# Patient Record
Sex: Male | Born: 1985 | Marital: Single | Smoking: Current every day smoker
Health system: Southern US, Community
[De-identification: ages and names within clinical notes are randomized; demographics above are authoritative.]

## PROBLEM LIST (undated history)

## (undated) DIAGNOSIS — S060X9A Concussion with loss of consciousness of unspecified duration, initial encounter: Secondary | ICD-10-CM

## (undated) DIAGNOSIS — F419 Anxiety disorder, unspecified: Secondary | ICD-10-CM

## (undated) DIAGNOSIS — F32A Depression, unspecified: Secondary | ICD-10-CM

## (undated) DIAGNOSIS — S060XAA Concussion with loss of consciousness status unknown, initial encounter: Secondary | ICD-10-CM

## (undated) HISTORY — DX: Concussion with loss of consciousness status unknown, initial encounter: S06.0XAA

## (undated) HISTORY — DX: Concussion with loss of consciousness of unspecified duration, initial encounter: S06.0X9A

---

## 2016-12-31 ENCOUNTER — Ambulatory Visit (INDEPENDENT_AMBULATORY_CARE_PROVIDER_SITE_OTHER): Payer: Managed Care, Other (non HMO) | Admitting: Family Medicine

## 2016-12-31 ENCOUNTER — Encounter: Payer: Self-pay | Admitting: Family Medicine

## 2016-12-31 VITALS — BP 160/100 | HR 96 | Temp 98.2°F | Ht 65.0 in | Wt 202.4 lb

## 2016-12-31 DIAGNOSIS — F32A Depression, unspecified: Secondary | ICD-10-CM

## 2016-12-31 DIAGNOSIS — R030 Elevated blood-pressure reading, without diagnosis of hypertension: Secondary | ICD-10-CM | POA: Diagnosis not present

## 2016-12-31 DIAGNOSIS — F419 Anxiety disorder, unspecified: Secondary | ICD-10-CM | POA: Diagnosis not present

## 2016-12-31 DIAGNOSIS — F329 Major depressive disorder, single episode, unspecified: Secondary | ICD-10-CM | POA: Diagnosis not present

## 2016-12-31 DIAGNOSIS — F17210 Nicotine dependence, cigarettes, uncomplicated: Secondary | ICD-10-CM | POA: Insufficient documentation

## 2016-12-31 DIAGNOSIS — Z720 Tobacco use: Secondary | ICD-10-CM | POA: Diagnosis not present

## 2016-12-31 DIAGNOSIS — Z8679 Personal history of other diseases of the circulatory system: Secondary | ICD-10-CM | POA: Insufficient documentation

## 2016-12-31 MED ORDER — ESCITALOPRAM OXALATE 10 MG PO TABS: 10.0000 mg | ORAL_TABLET | Freq: Every day | ORAL | 1 refills | Status: DC

## 2016-12-31 NOTE — Assessment & Plan Note (Signed)
Discuss smoking cessation. Patient is not a candidate for Chantix. I think Wellbutrin would possibly be a little stimulating given his anxiety. We'll see how he does with Lexapro for his depression and anxiety and see if that will make it easier for him to use nicotine supplementation.

## 2016-12-31 NOTE — Assessment & Plan Note (Signed)
Elevated blood pressure though no prior history of hypertension. 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 Discussed the option of starting on medication today though we decided to hold off on starting medication today and decided on having him check his blood pressure on several occasions at home and let us know what it has been running. He has follow-up in one week for a nurse visit for blood pressure check. We will consider medication if still elevated at that time.

## 2016-12-31 NOTE — Assessment & Plan Note (Signed)
Patient with anxiety and depression. No prior medication for this. He does see a therapist. No SI or HI. Discussed starting on Lexapro. He'll continue to see his therapist. Given return precautions. Follow-up in 2 months.

## 2016-12-31 NOTE — Progress Notes (Signed)
Tommi Rumps, MD Phone: [phone]  Craig Shelton is a 31 y.o. male who presents today for new patient visit.  Depression/anxiety: Patient notes he has had issues with depression and anxiety for some time now. He is followed by a therapist and has seen her for the last 2-3 years. He notes he feels like this has gotten worse over the years. He's never been on medication. He has a hard time concentrating as well and wonders if he has ADD. No SI or HI.  Elevated blood pressure: No history of elevated blood pressure. He does drink 2 energy drinks per day. No chest pain or shortness of breath. 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  Tobacco abuse: He does smoke about a pack and half per day since he was [age] years old. He is interested in quitting though he has tried nicotine replacement previously. He has heard about Chantix though doesn't think he wants to do this and I think that is a good decision given his anxiety and depression.  Active Ambulatory Problems    Diagnosis Date Noted  . Anxiety and depression 12/31/2016  . Elevated blood pressure reading 12/31/2016  . Tobacco abuse 12/31/2016   Resolved Ambulatory Problems    Diagnosis Date Noted  . No Resolved Ambulatory Problems   No Additional Past Medical History    Family History  Problem Relation Age of Onset  . Colon cancer Mother   . Prostate cancer Maternal Grandfather   . Stomach cancer Paternal Grandmother   . Prostate cancer Paternal Grandfather     Social History   Social History  . Marital status: Single    Spouse name: N/A  . Number of children: N/A  . Years of education: N/A   Occupational History  . Not on file.   Social History Main Topics  . Smoking status: Current Every Day Smoker  . Smokeless tobacco: Never Used  . Alcohol use Yes  . Drug use: No  . Sexual activity: Not on file   Other Topics Concern  . Not on file    Social History Narrative  . No narrative on file    ROS  General:  Negative for nexplained weight loss, fever Skin: Negative for new or changing mole, sore that won't heal HEENT: Negative for trouble hearing, trouble seeing, ringing in ears, mouth sores, hoarseness, change in voice, dysphagia. CV:  Negative for chest pain, dyspnea, edema, palpitations Resp: Negative for cough, dyspnea, hemoptysis GI: Negative for nausea, vomiting, diarrhea, constipation, abdominal pain, melena, hematochezia. GU: Negative for dysuria, incontinence, urinary hesitance, hematuria, vaginal or penile discharge, polyuria, sexual difficulty, lumps in testicle or breasts MSK: Negative for muscle cramps or aches, joint pain or swelling Neuro: Negative for headaches, weakness, numbness, dizziness, passing out/fainting Psych: Positive for depression, anxiety, memory problems  Objective  Physical Exam Vitals:   12/31/16 1514 12/31/16 1541  BP: (!) 190/120 (!) 160/100  Pulse: 96   Temp: 98.2 F (36.8 C)   SpO2: 99%     BP Readings from Last 3 Encounters:  12/31/16 (!) 160/100   Wt Readings from Last 3 Encounters:  12/31/16 202 lb 6.4 oz (91.8 kg)    Physical Exam  Constitutional: No distress.  HENT:  Head: Normocephalic and atraumatic.  Mouth/Throat: Oropharynx is clear and moist. No oropharyngeal exudate.  Eyes: Pupils are equal, round, and reactive to light. Conjunctivae are normal.  Neck: Neck supple.  Cardiovascular: Normal rate, regular rhythm and normal heart sounds.   Pulmonary/Chest: Effort normal and breath sounds normal.  Abdominal: Soft. Bowel sounds are normal.  Musculoskeletal: He exhibits no edema.  Lymphadenopathy:    He has no cervical adenopathy.  Neurological: He is alert. Gait normal.  Skin: Skin is warm and dry. He is not diaphoretic.  Psychiatric:  Mood depressed, affect flat     Assessment/Plan:   Anxiety and depression Patient with anxiety and depression. No  prior medication for this. He does see a therapist. No SI or HI. Discussed starting on Lexapro. He'll continue to see his therapist. Given return precautions. Follow-up in 2 months.  Elevated blood pressure reading Elevated blood pressure though no prior history of hypertension. 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 Discussed the option of starting on medication today though we decided to hold off on starting medication today and decided on having him check his blood pressure on several occasions at home and let us know what it has been running. He has follow-up in one week for a nurse visit for blood pressure check. We will consider medication if still elevated at that time.  Tobacco abuse Discuss smoking cessation. Patient is not a candidate for Chantix. I think Wellbutrin would possibly be a little stimulating given his anxiety. We'll see how he does with Lexapro for his depression and anxiety and see if that will make it easier for him to use nicotine supplementation.   Orders Placed This Encounter  Procedures  . Comp Met (CMET)  . CBC  . TSH    Meds ordered this encounter  Medications  . escitalopram (LEXAPRO) 10 MG tablet    Sig: Take 1 tablet (10 mg total) by mouth daily.    Dispense:  90 tablet    Refill:  1     Tommi Rumps, MD [place]

## 2016-12-31 NOTE — Patient Instructions (Signed)
Nice to see you. We are going to start you on Lexapro for anxiety and depression. If you develop any side effects from this please let us know. Please do not discontinue this all at once. We will have you check your blood pressure a couple of times outside of the office and call us with the results. We'll have you come back in about a week for nurse check of your blood pressure. We will call with the lab results.

## 2017-01-01 LAB — COMPREHENSIVE METABOLIC PANEL
ALBUMIN: 4.7 g/dL (ref 3.5–5.2)
ALK PHOS: 57 U/L (ref 39–117)
ALT: 25 U/L (ref 0–53)
AST: 22 U/L (ref 0–37)
BILIRUBIN TOTAL: 0.6 mg/dL (ref 0.2–1.2)
BUN: 10 mg/dL (ref 6–23)
CO2: 30 mEq/L (ref 19–32)
CREATININE: 0.99 mg/dL (ref 0.40–1.50)
Calcium: 9.9 mg/dL (ref 8.4–10.5)
Chloride: 104 mEq/L (ref 96–112)
GFR: 94.22 mL/min (ref >60.00)
Glucose, Bld: 93 mg/dL (ref 70–99)
Potassium: 3.9 mEq/L (ref 3.5–5.1)
SODIUM: 140 meq/L (ref 135–145)
TOTAL PROTEIN: 7.3 g/dL (ref 6.0–8.3)

## 2017-01-01 LAB — CBC
HCT: 47.3 % (ref 39.0–52.0)
Hemoglobin: 16.2 g/dL (ref 13.0–17.0)
MCHC: 34.2 g/dL (ref 30.0–36.0)
MCV: 93.8 fl (ref 78.0–100.0)
Platelets: 222 10*3/uL (ref 150.0–400.0)
RBC: 5.04 Mil/uL (ref 4.22–5.81)
RDW: 13.4 % (ref 11.5–15.5)
WBC: 9.5 10*3/uL (ref 4.0–10.5)

## 2017-01-01 LAB — TSH: TSH: 1.47 u[IU]/mL (ref 0.35–4.50)

## 2017-01-02 ENCOUNTER — Telehealth: Payer: Self-pay | Admitting: Family Medicine

## 2017-01-02 NOTE — Telephone Encounter (Signed)
Advised patient of results. thanks

## 2017-01-02 NOTE — Telephone Encounter (Signed)
Pt called back returning your call. Thank you! °

## 2017-03-04 ENCOUNTER — Ambulatory Visit (INDEPENDENT_AMBULATORY_CARE_PROVIDER_SITE_OTHER): Payer: Managed Care, Other (non HMO) | Admitting: Family Medicine

## 2017-03-04 ENCOUNTER — Encounter: Payer: Self-pay | Admitting: Family Medicine

## 2017-03-04 VITALS — BP 138/90 | HR 80 | Temp 98.2°F | Wt 209.8 lb

## 2017-03-04 DIAGNOSIS — F419 Anxiety disorder, unspecified: Secondary | ICD-10-CM

## 2017-03-04 DIAGNOSIS — R030 Elevated blood-pressure reading, without diagnosis of hypertension: Secondary | ICD-10-CM

## 2017-03-04 DIAGNOSIS — R41840 Attention and concentration deficit: Secondary | ICD-10-CM

## 2017-03-04 DIAGNOSIS — F329 Major depressive disorder, single episode, unspecified: Secondary | ICD-10-CM

## 2017-03-04 MED ORDER — ESCITALOPRAM OXALATE 20 MG PO TABS: 20.0000 mg | ORAL_TABLET | Freq: Every day | ORAL | 2 refills | Status: DC

## 2017-03-04 NOTE — Progress Notes (Signed)
  Craig AlarEric Chilton Sallade, MD Phone: [phone]  Craig Shelton is a 31 y.o. male who presents today for follow-up.  Elevated blood pressure: He is not checking at home. No chest pain or shortness of breath. No history of medication.  Depression/anxiety: He notes no depression. Notes anxiety is quite a bit improved though still does have some symptoms. Notes he previously felt as though there was a dark cloud over him though that is not as bad. Has been seeing a therapist. Lexapro has been beneficial. No SI or HI. He does continue to have issues with focusing and feels that may be an issue as well.  ROS see history of present illness  Objective  Physical Exam Vitals:   03/04/17 1552 03/04/17 1613  BP: (!) 160/92 138/90  Pulse: 80   Temp: 98.2 F (36.8 C)   SpO2: 97%     BP Readings from Last 3 Encounters:  03/04/17 138/90  12/31/16 (!) 160/100   Wt Readings from Last 3 Encounters:  03/04/17 209 lb 12.8 oz (95.2 kg)  12/31/16 202 lb 6.4 oz (91.8 kg)    Physical Exam  Constitutional: No distress.  Cardiovascular: Normal rate, regular rhythm and normal heart sounds.   Pulmonary/Chest: Effort normal and breath sounds normal.  Neurological: He is alert. Gait normal.  Skin: He is not diaphoretic.  Psychiatric:  Affect flat     Assessment/Plan: Please see individual problem list.  Anxiety and depression Increase Lexapro. Refer to psychology for ADHD testing.  Elevated blood pressure reading Improved on recheck. Discussed option of starting medication versus monitoring at home. He will buy a blood pressure cuff and check at home for the next 1-2 weeks and call us with the results. If consistently higher than 140/90 at home will start on medication.   Orders Placed This Encounter  Procedures  . Ambulatory referral to Psychology    Referral Priority:   Routine    Referral Type:   Psychiatric    Referral Reason:   Specialty Services Required    Requested Specialty:    Psychology    Number of Visits Requested:   1    Meds ordered this encounter  Medications  . escitalopram (LEXAPRO) 20 MG tablet    Sig: Take 1 tablet (20 mg total) by mouth daily.    Dispense:  90 tablet    Refill:  2   Craig AlarEric Gaspar Fowle, MD [facility] [place]

## 2017-03-04 NOTE — Patient Instructions (Signed)
Nice to see you. We will increase your Lexapro to 20 mg daily. You may take two 10 mg tablets daily until you run out of your current supply and then start taking the 20 mg tablet. Please pick up a blood pressure cuff (brand omron) check your blood pressure daily. Please do this when you're at home and relaxed. Your goal is less than 140/90. Please contact us in 1-2 weeks with your readings.

## 2017-03-04 NOTE — Assessment & Plan Note (Addendum)
Increase Lexapro. Refer to psychology for ADHD testing.

## 2017-03-04 NOTE — Assessment & Plan Note (Signed)
Improved on recheck. Discussed option of starting medication versus monitoring at home. He will buy a blood pressure cuff and check at home for the next 1-2 weeks and call us with the results. If consistently higher than 140/90 at home will start on medication.

## 2017-03-05 NOTE — Progress Notes (Signed)
Patient notified

## 2017-06-05 ENCOUNTER — Ambulatory Visit: Payer: Managed Care, Other (non HMO) | Admitting: Family Medicine

## 2017-07-17 ENCOUNTER — Telehealth: Payer: Self-pay | Admitting: Family Medicine

## 2017-07-17 NOTE — Telephone Encounter (Signed)
PT dropped off paper work, he needs a letter for work about his anxiety meds. Papers are up front in color folder

## 2017-07-18 NOTE — Telephone Encounter (Signed)
Placed on your desk. 

## 2017-07-22 ENCOUNTER — Other Ambulatory Visit: Payer: Self-pay

## 2017-07-22 ENCOUNTER — Ambulatory Visit: Payer: Managed Care, Other (non HMO) | Admitting: Family Medicine

## 2017-07-22 ENCOUNTER — Encounter: Payer: Self-pay | Admitting: Family Medicine

## 2017-07-22 VITALS — BP 140/80 | HR 100 | Temp 98.2°F | Wt 211.0 lb

## 2017-07-22 DIAGNOSIS — F419 Anxiety disorder, unspecified: Secondary | ICD-10-CM | POA: Diagnosis not present

## 2017-07-22 DIAGNOSIS — G8929 Other chronic pain: Secondary | ICD-10-CM

## 2017-07-22 DIAGNOSIS — M5442 Lumbago with sciatica, left side: Secondary | ICD-10-CM | POA: Diagnosis not present

## 2017-07-22 DIAGNOSIS — R41840 Attention and concentration deficit: Secondary | ICD-10-CM | POA: Diagnosis not present

## 2017-07-22 DIAGNOSIS — F329 Major depressive disorder, single episode, unspecified: Secondary | ICD-10-CM

## 2017-07-22 DIAGNOSIS — M545 Low back pain: Secondary | ICD-10-CM

## 2017-07-22 NOTE — Assessment & Plan Note (Signed)
Improved.  Continue Lexapro.  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

## 2017-07-22 NOTE — Progress Notes (Signed)
  Craig AlarEric Aunika Kirsten, MD Phone: [phone]  Craig Shelton is a 32 y.o. male who presents today for follow-up.  Anxiety: Notes this is quite a bit better.  No depressive symptoms.  He has been on Lexapro with no side effects.  No SI.  He continues to intermittently see a therapist.  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  Attention deficit: He has had difficulty with concentrating and completing tasks for many years at work and at home.  He did not get the testing previously.  Chronic intermittent low back pain.  This is been going on for some time.  Typically related to what he is doing at work.  Occasionally does radiate down his left leg down the back and his leg will give out on him with this.  No numbness.  No saddle anesthesia.  No specific weakness.  No bowel or bladder incontinence.  He has not had any issues with it radiating down his leg in some time.  He does some stretches for it.  Does report a history of a concussion about 5 years ago when a tree branch hit him on the head while at work.  He has not had any lasting effects from this.  Social History   Tobacco Use  Smoking Status Current Every Day Smoker  Smokeless Tobacco Never Used     ROS see history of present illness  Objective  Physical Exam Vitals:   07/22/17 1324  BP: 140/80  Pulse: 100  Temp: 98.2 F (36.8 C)  SpO2: 94%    BP Readings from Last 3 Encounters:  07/22/17 140/80  03/04/17 138/90  12/31/16 (!) 160/100   Wt Readings from Last 3 Encounters:  07/22/17 211 lb (95.7 kg)  03/04/17 209 lb 12.8 oz (95.2 kg)  12/31/16 202 lb 6.4 oz (91.8 kg)    Physical Exam  Constitutional: No distress.  Cardiovascular: Normal rate, regular rhythm and normal heart sounds.  Pulmonary/Chest: Effort normal and breath sounds normal.  Musculoskeletal: He exhibits no edema.  No midline spine tenderness, no midline spine step-off, no muscular back tenderness, 5/5 strength bilateral quads, hamstrings,  plantar flexion, and dorsiflexion, sensation light touch intact bilateral lower extremities  Neurological: He is alert. Gait normal.  Skin: Skin is warm and dry. He is not diaphoretic.     Assessment/Plan: Please see individual problem list.  Anxiety and depression Improved.  Continue Lexapro.  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  Attention deficit We will refer again for psychological testing for ADHD.  Chronic low back pain Intermittent issues with this.  Doing well at this time.  No recent sciatica.  Given exercises to complete.  Given return precautions.   Orders Placed This Encounter  Procedures  . Ambulatory referral to Psychology    Referral Priority:   Routine    Referral Type:   Psychiatric    Referral Reason:   Specialty Services Required    Requested Specialty:   Psychology    Number of Visits Requested:   1    No orders of the defined types were placed in this encounter.    Craig AlarEric Kobie Matkins, MD [facility] [place]

## 2017-07-22 NOTE — Assessment & Plan Note (Signed)
Intermittent issues with this.  Doing well at this time.  No recent sciatica.  Given exercises to complete.  Given return precautions.

## 2017-07-22 NOTE — Patient Instructions (Signed)
Nice to see you. We will get you evaluated for ADHD. Will get your paperwork for your Lexapro filled out. Please do the exercises for your back and see if that helps.   Back Exercises If you have pain in your back, do these exercises 2-3 times each day or as told by your doctor. When the pain goes away, do the exercises once each day, but repeat the steps more times for each exercise (do more repetitions). If you do not have pain in your back, do these exercises once each day or as told by your doctor. Exercises Single Knee to Chest  Do these steps 3-5 times in a row for each leg: 1. Lie on your back on a firm bed or the floor with your legs stretched out. 2. Bring one knee to your chest. 3. Hold your knee to your chest by grabbing your knee or thigh. 4. Pull on your knee until you feel a gentle stretch in your lower back. 5. Keep doing the stretch for 10-30 seconds. 6. Slowly let go of your leg and straighten it.  Pelvic Tilt  Do these steps 5-10 times in a row: 1. Lie on your back on a firm bed or the floor with your legs stretched out. 2. Bend your knees so they point up to the ceiling. Your feet should be flat on the floor. 3. Tighten your lower belly (abdomen) muscles to press your lower back against the floor. This will make your tailbone point up to the ceiling instead of pointing down to your feet or the floor. 4. Stay in this position for 5-10 seconds while you gently tighten your muscles and breathe evenly.  Cat-Cow  Do these steps until your lower back bends more easily: 1. Get on your hands and knees on a firm surface. Keep your hands under your shoulders, and keep your knees under your hips. You may put padding under your knees. 2. Let your head hang down, and make your tailbone point down to the floor so your lower back is round like the back of a cat. 3. Stay in this position for 5 seconds. 4. Slowly lift your head and make your tailbone point up to the ceiling so your  back hangs low (sags) like the back of a cow. 5. Stay in this position for 5 seconds.  Press-Ups  Do these steps 5-10 times in a row: 1. Lie on your belly (face-down) on the floor. 2. Place your hands near your head, about shoulder-width apart. 3. While you keep your back relaxed and keep your hips on the floor, slowly straighten your arms to raise the top half of your body and lift your shoulders. Do not use your back muscles. To make yourself more comfortable, you may change where you place your hands. 4. Stay in this position for 5 seconds. 5. Slowly return to lying flat on the floor.  Bridges  Do these steps 10 times in a row: 1. Lie on your back on a firm surface. 2. Bend your knees so they point up to the ceiling. Your feet should be flat on the floor. 3. Tighten your butt muscles and lift your butt off of the floor until your waist is almost as high as your knees. If you do not feel the muscles working in your butt and the back of your thighs, slide your feet 1-2 inches farther away from your butt. 4. Stay in this position for 3-5 seconds. 5. Slowly lower your butt to  the floor, and let your butt muscles relax.  If this exercise is too easy, try doing it with your arms crossed over your chest. Belly Crunches  Do these steps 5-10 times in a row: 1. Lie on your back on a firm bed or the floor with your legs stretched out. 2. Bend your knees so they point up to the ceiling. Your feet should be flat on the floor. 3. Cross your arms over your chest. 4. Tip your chin a little bit toward your chest but do not bend your neck. 5. Tighten your belly muscles and slowly raise your chest just enough to lift your shoulder blades a tiny bit off of the floor. 6. Slowly lower your chest and your head to the floor.  Back Lifts Do these steps 5-10 times in a row: 1. Lie on your belly (face-down) with your arms at your sides, and rest your forehead on the floor. 2. Tighten the muscles in your  legs and your butt. 3. Slowly lift your chest off of the floor while you keep your hips on the floor. Keep the back of your head in line with the curve in your back. Look at the floor while you do this. 4. Stay in this position for 3-5 seconds. 5. Slowly lower your chest and your face to the floor.  Contact a doctor if:  Your back pain gets a lot worse when you do an exercise.  Your back pain does not lessen 2 hours after you exercise. If you have any of these problems, stop doing the exercises. Do not do them again unless your doctor says it is okay. Get help right away if:  You have sudden, very bad back pain. If this happens, stop doing the exercises. Do not do them again unless your doctor says it is okay. This information is not intended to replace advice given to you by your health care provider. Make sure you discuss any questions you have with your health care provider. Document Released: 05/26/2010 Document Revised: 09/29/2015 Document Reviewed: 06/17/2014 Elsevier Interactive Patient Education  [employer]2018 [employer]

## 2017-07-22 NOTE — Assessment & Plan Note (Signed)
We will refer again for psychological testing for ADHD.

## 2017-07-23 NOTE — Telephone Encounter (Signed)
Completed, faxed and placed at front desk for patient to pick up. Patient notified.

## 2017-07-23 NOTE — Telephone Encounter (Signed)
Completed. Please fill in our information and fax to the number provided on the card. Please also make available for the patient to pick up. Thanks.

## 2017-11-14 ENCOUNTER — Ambulatory Visit: Payer: Managed Care, Other (non HMO) | Admitting: Internal Medicine

## 2017-11-14 ENCOUNTER — Encounter: Payer: Self-pay | Admitting: Internal Medicine

## 2017-11-14 VITALS — BP 152/88 | HR 106 | Temp 98.2°F | Ht 65.0 in | Wt 198.6 lb

## 2017-11-14 DIAGNOSIS — I10 Essential (primary) hypertension: Secondary | ICD-10-CM | POA: Diagnosis not present

## 2017-11-14 DIAGNOSIS — J329 Chronic sinusitis, unspecified: Secondary | ICD-10-CM

## 2017-11-14 MED ORDER — AMOXICILLIN-POT CLAVULANATE 875-125 MG PO TABS: 1.0000 | ORAL_TABLET | Freq: Two times a day (BID) | ORAL | 0 refills | Status: DC

## 2017-11-14 NOTE — Progress Notes (Signed)
Chief Complaint  Patient presents with  . Sinusitis  . Fever   Sick visit c/o weds/thurs last week stuffy nose/head, body aches, no sick contacts also slight fever, sinus pressure and temple b/l pressure and behind eyes. Right >left ear pain Monday/tuesday. He is a smoker and has seasonal allergies. Only tried sudafed. Denies sore throat    Review of Systems  Constitutional: Positive for diaphoresis and fever.  HENT: Positive for sinus pain. Negative for sore throat.   Respiratory: Positive for cough. Negative for wheezing.   Neurological: Positive for headaches.   Past Medical History:  Diagnosis Date  . Concussion    History reviewed. No pertinent surgical history. Family History  Problem Relation Age of Onset  . Colon cancer Mother   . Prostate cancer Maternal Grandfather   . Stomach cancer Paternal Grandmother   . Prostate cancer Paternal Grandfather    Social History   Socioeconomic History  . Marital status: Single    Spouse name: Not on file  . Number of children: Not on file  . Years of education: Not on file  . Highest education level: Not on file  Occupational History  . Not on file  Social Needs  . Financial resource strain: Not on file  . Food insecurity:    Worry: Not on file    Inability: Not on file  . Transportation needs:    Medical: Not on file    Non-medical: Not on file  Tobacco Use  . Smoking status: Current Every Day Smoker  . Smokeless tobacco: Never Used  Substance and Sexual Activity  . Alcohol use: Yes  . Drug use: No  . Sexual activity: Not on file  Lifestyle  . Physical activity:    Days per week: Not on file    Minutes per session: Not on file  . Stress: Not on file  Relationships  . Social connections:    Talks on phone: Not on file    Gets together: Not on file    Attends religious service: Not on file    Active member of club or organization: Not on file    Attends meetings of clubs or organizations: Not on file   Relationship status: Not on file  . Intimate partner violence:    Fear of current or ex partner: Not on file    Emotionally abused: Not on file    Physically abused: Not on file    Forced sexual activity: Not on file  Other Topics Concern  . Not on file  Social History Narrative  . Not on file   Current Meds  Medication Sig  . escitalopram (LEXAPRO) 20 MG tablet Take 1 tablet (20 mg total) by mouth daily.   No Known Allergies No results found for this or any previous visit (from the past 2160 hour(s)). Objective  Body mass index is 33.05 kg/m. Wt Readings from Last 3 Encounters:  11/14/17 198 lb 9.6 oz (90.1 kg)  07/22/17 211 lb (95.7 kg)  03/04/17 209 lb 12.8 oz (95.2 kg)   Temp Readings from Last 3 Encounters:  11/14/17 98.2 F (36.8 C) (Oral)  07/22/17 98.2 F (36.8 C) (Oral)  03/04/17 98.2 F (36.8 C) (Oral)   BP Readings from Last 3 Encounters:  11/14/17 (!) 152/88  07/22/17 140/80  03/04/17 138/90   Pulse Readings from Last 3 Encounters:  11/14/17 (!) 106  07/22/17 100  03/04/17 80    Physical Exam  Constitutional: He is oriented to person, place, and  time. He appears well-developed and well-nourished. He is cooperative.  HENT:  Head: Normocephalic and atraumatic.  Nose: Right sinus exhibits maxillary sinus tenderness. Left sinus exhibits maxillary sinus tenderness.  Mouth/Throat: Oropharynx is clear and moist and mucous membranes are normal.  Eyes: Pupils are equal, round, and reactive to light. Conjunctivae are normal.  Cardiovascular: Normal rate, regular rhythm and normal heart sounds.  Pulmonary/Chest: Effort normal. He has wheezes.  Mild wheezing on right   Neurological: He is alert and oriented to person, place, and time. Gait normal.  Skin: Skin is warm, dry and intact.  Psychiatric: He has a normal mood and affect. His speech is normal and behavior is normal. Judgment and thought content normal. Cognition and memory are normal.  Nursing note and  vitals reviewed.   Assessment   1. Sinusitis 2. HTN Plan   1. Augmentin, supportive care rec ns, flonase  Call back if not better  rec stop sudafed if been >3 days only use 3 days or less  2. Declined tx today though BP elevated almost 1 year of office visits he wants to disc with PCP  Provider: Dr. French Ana McLean-Scocuzza-Internal Medicine

## 2017-11-14 NOTE — Patient Instructions (Addendum)
Please rest, stay hydrated with water and follow up with Dr. Kathie Rhodes in 2-3 weeks  Sinusitis, Adult Sinusitis is soreness and inflammation of your sinuses. Sinuses are hollow spaces in the bones around your face. Your sinuses are located:  Around your eyes.  In the middle of your forehead.  Behind your nose.  In your cheekbones.  Your sinuses and nasal passages are lined with a stringy fluid (mucus). Mucus normally drains out of your sinuses. When your nasal tissues become inflamed or swollen, the mucus can become trapped or blocked so air cannot flow through your sinuses. This allows bacteria, viruses, and funguses to grow, which leads to infection. Sinusitis can develop quickly and last for 7?10 days (acute) or for more than 12 weeks (chronic). Sinusitis often develops after a cold. What are the causes? This condition is caused by anything that creates swelling in the sinuses or stops mucus from draining, including:  Allergies.  Asthma.  Bacterial or viral infection.  Abnormally shaped bones between the nasal passages.  Nasal growths that contain mucus (nasal polyps).  Narrow sinus openings.  Pollutants, such as chemicals or irritants in the air.  A foreign object stuck in the nose.  A fungal infection. This is rare.  What increases the risk? The following factors may make you more likely to develop this condition:  Having allergies or asthma.  Having had a recent cold or respiratory tract infection.  Having structural deformities or blockages in your nose or sinuses.  Having a weak immune system.  Doing a lot of swimming or diving.  Overusing nasal sprays.  Smoking.  What are the signs or symptoms? The main symptoms of this condition are pain and a feeling of pressure around the affected sinuses. Other symptoms include:  Upper toothache.  Earache.  Headache.  Bad breath.  Decreased sense of smell and taste.  A cough that may get worse at  night.  Fatigue.  Fever.  Thick drainage from your nose. The drainage is often green and it may contain pus (purulent).  Stuffy nose or congestion.  Postnasal drip. This is when extra mucus collects in the throat or back of the nose.  Swelling and warmth over the affected sinuses.  Sore throat.  Sensitivity to light.  How is this diagnosed? This condition is diagnosed based on symptoms, a medical history, and a physical exam. To find out if your condition is acute or chronic, your health care provider may:  Look in your nose for signs of nasal polyps.  Tap over the affected sinus to check for signs of infection.  View the inside of your sinuses using an imaging device that has a light attached (endoscope).  If your health care provider suspects that you have chronic sinusitis, you may also:  Be tested for allergies.  Have a sample of mucus taken from your nose (nasal culture) and checked for bacteria.  Have a mucus sample examined to see if your sinusitis is related to an allergy.  If your sinusitis does not respond to treatment and it lasts longer than 8 weeks, you may have an MRI or CT scan to check your sinuses. These scans also help to determine how severe your infection is. In rare cases, a bone biopsy may be done to rule out more serious types of fungal sinus disease. How is this treated? Treatment for sinusitis depends on the cause and whether your condition is chronic or acute. If a virus is causing your sinusitis, your symptoms will go  away on their own within 10 days. You may be given medicines to relieve your symptoms, including:  Topical nasal decongestants. They shrink swollen nasal passages and let mucus drain from your sinuses.  Antihistamines. These drugs block inflammation that is triggered by allergies. This can help to ease swelling in your nose and sinuses.  Topical nasal corticosteroids. These are nasal sprays that ease inflammation and swelling in  your nose and sinuses.  Nasal saline washes. These rinses can help to get rid of thick mucus in your nose.  If your condition is caused by bacteria, you will be given an antibiotic medicine. If your condition is caused by a fungus, you will be given an antifungal medicine. Surgery may be needed to correct underlying conditions, such as narrow nasal passages. Surgery may also be needed to remove polyps. Follow these instructions at home: Medicines  Take, use, or apply over-the-counter and prescription medicines only as told by your health care provider. These may include nasal sprays.  If you were prescribed an antibiotic medicine, take it as told by your health care provider. Do not stop taking the antibiotic even if you start to feel better. Hydrate and Humidify  Drink enough water to keep your urine clear or pale yellow. Staying hydrated will help to thin your mucus.  Use a cool mist humidifier to keep the humidity level in your home above 50%.  Inhale steam for 10-15 minutes, 3-4 times a day or as told by your health care provider. You can do this in the bathroom while a hot shower is running.  Limit your exposure to cool or dry air. Rest  Rest as much as possible.  Sleep with your head raised (elevated).  Make sure to get enough sleep each night. General instructions  Apply a warm, moist washcloth to your face 3-4 times a day or as told by your health care provider. This will help with discomfort.  Wash your hands often with soap and water to reduce your exposure to viruses and other germs. If soap and water are not available, use hand sanitizer.  Do not smoke. Avoid being around people who are smoking (secondhand smoke).  Keep all follow-up visits as told by your health care provider. This is important. Contact a health care provider if:  You have a fever.  Your symptoms get worse.  Your symptoms do not improve within 10 days. Get help right away if:  You have a  severe headache.  You have persistent vomiting.  You have pain or swelling around your face or eyes.  You have vision problems.  You develop confusion.  Your neck is stiff.  You have trouble breathing. This information is not intended to replace advice given to you by your health care provider. Make sure you discuss any questions you have with your health care provider. Document Released: 04/23/2005 Document Revised: 12/18/2015 Document Reviewed: 02/16/2015 Elsevier Interactive Patient Education  2018 [employer].    DASH Eating Plan DASH stands for "Dietary Approaches to Stop Hypertension." The DASH eating plan is a healthy eating plan that has been shown to reduce high blood pressure (hypertension). It may also reduce your risk for type 2 diabetes, heart disease, and stroke. The DASH eating plan may also help with weight loss. What are tips for following this plan? General guidelines  Avoid eating more than 2,300 mg (milligrams) of salt (sodium) a day. If you have hypertension, you may need to reduce your sodium intake to 1,500 mg a day.  Limit alcohol intake to no more than 1 drink a day for nonpregnant women and 2 drinks a day for men. One drink equals 12 oz of beer, 5 oz of wine, or 1 oz of hard liquor.  Work with your health care provider to maintain a healthy body weight or to lose weight. Ask what an ideal weight is for you.  Get at least 30 minutes of exercise that causes your heart to beat faster (aerobic exercise) most days of the week. Activities may include walking, swimming, or biking.  Work with your health care provider or diet and nutrition specialist (dietitian) to adjust your eating plan to your individual calorie needs. Reading food labels  Check food labels for the amount of sodium per serving. Choose foods with less than 5 percent of the Daily Value of sodium. Generally, foods with less than 300 mg of sodium per serving fit into this eating plan.  To find  whole grains, look for the word "whole" as the first word in the ingredient list. Shopping  Buy products labeled as "low-sodium" or "no salt added."  Buy fresh foods. Avoid canned foods and premade or frozen meals. Cooking  Avoid adding salt when cooking. Use salt-free seasonings or herbs instead of table salt or sea salt. Check with your health care provider or pharmacist before using salt substitutes.  Do not fry foods. Cook foods using healthy methods such as baking, boiling, grilling, and broiling instead.  Cook with heart-healthy oils, such as olive, canola, soybean, or sunflower oil. Meal planning   Eat a balanced diet that includes: ? 5 or more servings of fruits and vegetables each day. At each meal, try to fill half of your plate with fruits and vegetables. ? Up to 6-8 servings of whole grains each day. ? Less than 6 oz of lean meat, poultry, or fish each day. A 3-oz serving of meat is about the same size as a deck of cards. One egg equals 1 oz. ? 2 servings of low-fat dairy each day. ? A serving of nuts, seeds, or beans 5 times each week. ? Heart-healthy fats. Healthy fats called Omega-3 fatty acids are found in foods such as flaxseeds and coldwater fish, like sardines, salmon, and mackerel.  Limit how much you eat of the following: ? Canned or prepackaged foods. ? Food that is high in trans fat, such as fried foods. ? Food that is high in saturated fat, such as fatty meat. ? Sweets, desserts, sugary drinks, and other foods with added sugar. ? Full-fat dairy products.  Do not salt foods before eating.  Try to eat at least 2 vegetarian meals each week.  Eat more home-cooked food and less restaurant, buffet, and fast food.  When eating at a restaurant, ask that your food be prepared with less salt or no salt, if possible. What foods are recommended? The items listed may not be a complete list. Talk with your dietitian about what dietary choices are best for  you. Grains Whole-grain or whole-wheat bread. Whole-grain or whole-wheat pasta. Brown rice. Orpah Cobb. Bulgur. Whole-grain and low-sodium cereals. Pita bread. Low-fat, low-sodium crackers. Whole-wheat flour tortillas. Vegetables Fresh or frozen vegetables (raw, steamed, roasted, or grilled). Low-sodium or reduced-sodium tomato and vegetable juice. Low-sodium or reduced-sodium tomato sauce and tomato paste. Low-sodium or reduced-sodium canned vegetables. Fruits All fresh, dried, or frozen fruit. Canned fruit in natural juice (without added sugar). Meat and other protein foods Skinless chicken or [place]. Ground chicken or [place]. Pork with fat  trimmed off. Fish and seafood. Egg whites. Dried beans, peas, or lentils. Unsalted nuts, nut butters, and seeds. Unsalted canned beans. Lean cuts of beef with fat trimmed off. Low-sodium, lean deli meat. Dairy Low-fat (1%) or fat-free (skim) milk. Fat-free, low-fat, or reduced-fat cheeses. Nonfat, low-sodium ricotta or cottage cheese. Low-fat or nonfat yogurt. Low-fat, low-sodium cheese. Fats and oils Soft margarine without trans fats. Vegetable oil. Low-fat, reduced-fat, or light mayonnaise and salad dressings (reduced-sodium). Canola, safflower, olive, soybean, and sunflower oils. Avocado. Seasoning and other foods Herbs. Spices. Seasoning mixes without salt. Unsalted popcorn and pretzels. Fat-free sweets. What foods are not recommended? The items listed may not be a complete list. Talk with your dietitian about what dietary choices are best for you. Grains Baked goods made with fat, such as croissants, muffins, or some breads. Dry pasta or rice meal packs. Vegetables Creamed or fried vegetables. Vegetables in a cheese sauce. Regular canned vegetables (not low-sodium or reduced-sodium). Regular canned tomato sauce and paste (not low-sodium or reduced-sodium). Regular tomato and vegetable juice (not low-sodium or reduced-sodium). Rosita FirePickles.  Olives. Fruits Canned fruit in a light or heavy syrup. Fried fruit. Fruit in cream or butter sauce. Meat and other protein foods Fatty cuts of meat. Ribs. Fried meat. Tomasa BlaseBacon. Sausage. Bologna and other processed lunch meats. Salami. Fatback. Hotdogs. Bratwurst. Salted nuts and seeds. Canned beans with added salt. Canned or smoked fish. Whole eggs or egg yolks. Chicken or [place] with skin. Dairy Whole or 2% milk, cream, and half-and-half. Whole or full-fat cream cheese. Whole-fat or sweetened yogurt. Full-fat cheese. Nondairy creamers. Whipped toppings. Processed cheese and cheese spreads. Fats and oils Butter. Stick margarine. Lard. Shortening. Ghee. Bacon fat. Tropical oils, such as coconut, palm kernel, or palm oil. Seasoning and other foods Salted popcorn and pretzels. Onion salt, garlic salt, seasoned salt, table salt, and sea salt. Worcestershire sauce. Tartar sauce. Barbecue sauce. Teriyaki sauce. Soy sauce, including reduced-sodium. Steak sauce. Canned and packaged gravies. Fish sauce. Oyster sauce. Cocktail sauce. Horseradish that you find on the shelf. Ketchup. Mustard. Meat flavorings and tenderizers. Bouillon cubes. Hot sauce and Tabasco sauce. Premade or packaged marinades. Premade or packaged taco seasonings. Relishes. Regular salad dressings. Where to find more information:  National Heart, Lung, and Blood Institute: PopSteam.iswww.nhlbi.nih.gov  American Heart Association: www.heart.org Summary  The DASH eating plan is a healthy eating plan that has been shown to reduce high blood pressure (hypertension). It may also reduce your risk for type 2 diabetes, heart disease, and stroke.  With the DASH eating plan, you should limit salt (sodium) intake to 2,300 mg a day. If you have hypertension, you may need to reduce your sodium intake to 1,500 mg a day.  When on the DASH eating plan, aim to eat more fresh fruits and vegetables, whole grains, lean proteins, low-fat dairy, and heart-healthy  fats.  Work with your health care provider or diet and nutrition specialist (dietitian) to adjust your eating plan to your individual calorie needs. This information is not intended to replace advice given to you by your health care provider. Make sure you discuss any questions you have with your health care provider. Document Released: 04/12/2011 Document Revised: 04/16/2016 Document Reviewed: 04/16/2016 Elsevier Interactive Patient Education  2018 [employer]  Hypertension Hypertension, commonly called high blood pressure, is when the force of blood pumping through the arteries is too strong. The arteries are the blood vessels that carry blood from the heart throughout the body. Hypertension forces the heart to work harder to pump  blood and may cause arteries to become narrow or stiff. Having untreated or uncontrolled hypertension can cause heart attacks, strokes, kidney disease, and other problems. A blood pressure reading consists of a higher number over a lower number. Ideally, your blood pressure should be below 120/80. The first ("top") number is called the systolic pressure. It is a measure of the pressure in your arteries as your heart beats. The second ("bottom") number is called the diastolic pressure. It is a measure of the pressure in your arteries as the heart relaxes. What are the causes? The cause of this condition is not known. What increases the risk? Some risk factors for high blood pressure are under your control. Others are not. Factors you can change  Smoking.  Having type 2 diabetes mellitus, high cholesterol, or both.  Not getting enough exercise or physical activity.  Being overweight.  Having too much fat, sugar, calories, or salt (sodium) in your diet.  Drinking too much alcohol. Factors that are difficult or impossible to change  Having chronic kidney disease.  Having a family history of high blood pressure.  Age. Risk increases with age.  Race. You  may be at higher risk if you are African-American.  Gender. Men are at higher risk than women before age 43. After age 66, women are at higher risk than men.  Having obstructive sleep apnea.  Stress. What are the signs or symptoms? Extremely high blood pressure (hypertensive crisis) may cause:  Headache.  Anxiety.  Shortness of breath.  Nosebleed.  Nausea and vomiting.  Severe chest pain.  Jerky movements you cannot control (seizures).  How is this diagnosed? This condition is diagnosed by measuring your blood pressure while you are seated, with your arm resting on a surface. The cuff of the blood pressure monitor will be placed directly against the skin of your upper arm at the level of your heart. It should be measured at least twice using the same arm. Certain conditions can cause a difference in blood pressure between your right and left arms. Certain factors can cause blood pressure readings to be lower or higher than normal (elevated) for a short period of time:  When your blood pressure is higher when you are in a health care provider's office than when you are at home, this is called white coat hypertension. Most people with this condition do not need medicines.  When your blood pressure is higher at home than when you are in a health care provider's office, this is called masked hypertension. Most people with this condition may need medicines to control blood pressure.  If you have a high blood pressure reading during one visit or you have normal blood pressure with other risk factors:  You may be asked to return on a different day to have your blood pressure checked again.  You may be asked to monitor your blood pressure at home for 1 week or longer.  If you are diagnosed with hypertension, you may have other blood or imaging tests to help your health care provider understand your overall risk for other conditions. How is this treated? This condition is treated by  making healthy lifestyle changes, such as eating healthy foods, exercising more, and reducing your alcohol intake. Your health care provider may prescribe medicine if lifestyle changes are not enough to get your blood pressure under control, and if:  Your systolic blood pressure is above 130.  Your diastolic blood pressure is above 80.  Your personal target blood pressure  may vary depending on your medical conditions, your age, and other factors. Follow these instructions at home: Eating and drinking  Eat a diet that is high in fiber and potassium, and low in sodium, added sugar, and fat. An example eating plan is called the DASH (Dietary Approaches to Stop Hypertension) diet. To eat this way: ? Eat plenty of fresh fruits and vegetables. Try to fill half of your plate at each meal with fruits and vegetables. ? Eat whole grains, such as whole wheat pasta, brown rice, or whole grain bread. Fill about one quarter of your plate with whole grains. ? Eat or drink low-fat dairy products, such as skim milk or low-fat yogurt. ? Avoid fatty cuts of meat, processed or cured meats, and poultry with skin. Fill about one quarter of your plate with lean proteins, such as fish, chicken without skin, beans, eggs, and tofu. ? Avoid premade and processed foods. These tend to be higher in sodium, added sugar, and fat.  Reduce your daily sodium intake. Most people with hypertension should eat less than 1,500 mg of sodium a day.  Limit alcohol intake to no more than 1 drink a day for nonpregnant women and 2 drinks a day for men. One drink equals 12 oz of beer, 5 oz of wine, or 1 oz of hard liquor. Lifestyle  Work with your health care provider to maintain a healthy body weight or to lose weight. Ask what an ideal weight is for you.  Get at least 30 minutes of exercise that causes your heart to beat faster (aerobic exercise) most days of the week. Activities may include walking, swimming, or biking.  Include  exercise to strengthen your muscles (resistance exercise), such as pilates or lifting weights, as part of your weekly exercise routine. Try to do these types of exercises for 30 minutes at least 3 days a week.  Do not use any products that contain nicotine or tobacco, such as cigarettes and e-cigarettes. If you need help quitting, ask your health care provider.  Monitor your blood pressure at home as told by your health care provider.  Keep all follow-up visits as told by your health care provider. This is important. Medicines  Take over-the-counter and prescription medicines only as told by your health care provider. Follow directions carefully. Blood pressure medicines must be taken as prescribed.  Do not skip doses of blood pressure medicine. Doing this puts you at risk for problems and can make the medicine less effective.  Ask your health care provider about side effects or reactions to medicines that you should watch for. Contact a health care provider if:  You think you are having a reaction to a medicine you are taking.  You have headaches that keep coming back (recurring).  You feel dizzy.  You have swelling in your ankles.  You have trouble with your vision. Get help right away if:  You develop a severe headache or confusion.  You have unusual weakness or numbness.  You feel faint.  You have severe pain in your chest or abdomen.  You vomit repeatedly.  You have trouble breathing. Summary  Hypertension is when the force of blood pumping through your arteries is too strong. If this condition is not controlled, it may put you at risk for serious complications.  Your personal target blood pressure may vary depending on your medical conditions, your age, and other factors. For most people, a normal blood pressure is less than 120/80.  Hypertension is treated with  lifestyle changes, medicines, or a combination of both. Lifestyle changes include weight loss, eating a  healthy, low-sodium diet, exercising more, and limiting alcohol. This information is not intended to replace advice given to you by your health care provider. Make sure you discuss any questions you have with your health care provider. Document Released: 04/23/2005 Document Revised: 03/21/2016 Document Reviewed: 03/21/2016 Elsevier Interactive Patient Education  [employer].

## 2017-11-14 NOTE — Progress Notes (Signed)
Pre visit review using our clinic review tool, if applicable. No additional management support is needed unless otherwise documented below in the visit note. 

## 2017-12-13 ENCOUNTER — Other Ambulatory Visit: Payer: Self-pay | Admitting: Family Medicine

## 2017-12-13 MED ORDER — ESCITALOPRAM OXALATE 20 MG PO TABS: 20.0000 mg | ORAL_TABLET | Freq: Every day | ORAL | 0 refills | Status: DC

## 2017-12-13 NOTE — Telephone Encounter (Signed)
Copied from CRM [phone]. Topic: Quick Communication - Rx Refill/Question >> Dec 13, 2017  3:59 PM Burchel, Abbi R wrote: Medication: escitalopram (LEXAPRO) 20 MG tablet  Preferred Pharmacy:  [facility] [address] - [place], [place] - [id number] [place] [id number] Phone: [phone] Fax: [phone]    Pt was advised that RX refills may take up to 3 business days.

## 2017-12-18 ENCOUNTER — Other Ambulatory Visit: Payer: Self-pay | Admitting: Family Medicine

## 2018-01-24 ENCOUNTER — Ambulatory Visit: Payer: Managed Care, Other (non HMO) | Admitting: Family Medicine

## 2018-01-29 ENCOUNTER — Ambulatory Visit: Payer: Managed Care, Other (non HMO) | Admitting: Family Medicine

## 2018-01-29 ENCOUNTER — Encounter: Payer: Self-pay | Admitting: Family Medicine

## 2018-01-29 VITALS — BP 148/90 | HR 106 | Temp 98.5°F | Ht 65.0 in | Wt 204.6 lb

## 2018-01-29 DIAGNOSIS — I10 Essential (primary) hypertension: Secondary | ICD-10-CM | POA: Diagnosis not present

## 2018-01-29 NOTE — Assessment & Plan Note (Addendum)
Patient has not been checking his blood pressure at home.  I encouraged him to buy blood pressure cuff and start checking daily.  We will check lab work.  He will return in 2 weeks for BP check with nursing.  Consider starting medication if elevated at home.

## 2018-01-29 NOTE — Patient Instructions (Signed)
Nice to see you. We will check lab work today. Please add in exercise and work on your diet.  I have included some dietary instructions below. We will have you start checking your blood pressure at home.  We will have you return in 2 weeks for BP check with nursing and bringing in blood pressure readings.

## 2018-01-29 NOTE — Progress Notes (Signed)
  Tommi Rumps, MD Phone: [phone]  Craig Shelton is a 32 y.o. male who presents today for f/u.  CC: elevated BP  Patient does not check his blood pressure at home.  It has been elevated on several occasions in the office.  No chest pain, shortness of breath, or edema.  He is very active at work though no specific exercise.  Notes he does not use a lot of salt.  He eats sandwiches, rice, chicken, steak.  His father does have hypertension.  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  Social History   Tobacco Use  Smoking Status Current Every Day Smoker  Smokeless Tobacco Never Used  Tobacco Comment   smokes half a pack daily      ROS see history of present illness  Objective  Physical Exam Vitals:   01/29/18 1515  BP: (!) 148/90  Pulse: (!) 106  Temp: 98.5 F (36.9 C)  SpO2: 98%    BP Readings from Last 3 Encounters:  01/29/18 (!) 148/90  11/14/17 (!) 152/88  07/22/17 140/80   Wt Readings from Last 3 Encounters:  01/29/18 204 lb 9.6 oz (92.8 kg)  11/14/17 198 lb 9.6 oz (90.1 kg)  07/22/17 211 lb (95.7 kg)    Physical Exam  Constitutional: No distress.  Cardiovascular: Normal rate, regular rhythm and normal heart sounds.  Pulmonary/Chest: Effort normal and breath sounds normal.  Musculoskeletal: He exhibits no edema.  Neurological: He is alert.  Skin: Skin is warm and dry. He is not diaphoretic.     Assessment/Plan: Please see individual problem list.  Benign essential HTN Patient has not been checking his blood pressure at home.  I encouraged him to buy blood pressure cuff and start checking daily.  We will check lab work.  He will return in 2 weeks for BP check with nursing.  Consider starting medication if elevated at home.   Orders Placed This Encounter  Procedures  . Comp Met (CMET)  . Lipid panel  . TSH  . CBC    No orders of the defined types were placed in this  encounter.    Tommi Rumps, MD [place]

## 2018-01-30 LAB — TSH: TSH: 0.39 u[IU]/mL (ref 0.35–4.50)

## 2018-01-30 LAB — LIPID PANEL
CHOLESTEROL: 225 mg/dL — AB (ref 0–200)
HDL: 43.9 mg/dL (ref >39.00)
LDL CALC: 143 mg/dL — AB (ref 0–99)
NonHDL: 181.17
TRIGLYCERIDES: 189 mg/dL — AB (ref 0.0–149.0)
Total CHOL/HDL Ratio: 5
VLDL: 37.8 mg/dL (ref 0.0–40.0)

## 2018-01-30 LAB — CBC
HEMATOCRIT: 48.5 % (ref 39.0–52.0)
HEMOGLOBIN: 16.8 g/dL (ref 13.0–17.0)
MCHC: 34.6 g/dL (ref 30.0–36.0)
MCV: 91.9 fl (ref 78.0–100.0)
PLATELETS: 245 10*3/uL (ref 150.0–400.0)
RBC: 5.28 Mil/uL (ref 4.22–5.81)
RDW: 13.6 % (ref 11.5–15.5)
WBC: 8 10*3/uL (ref 4.0–10.5)

## 2018-01-30 LAB — COMPREHENSIVE METABOLIC PANEL
ALT: 31 U/L (ref 0–53)
AST: 20 U/L (ref 0–37)
Albumin: 4.5 g/dL (ref 3.5–5.2)
Alkaline Phosphatase: 68 U/L (ref 39–117)
BUN: 10 mg/dL (ref 6–23)
CHLORIDE: 102 meq/L (ref 96–112)
CO2: 28 mEq/L (ref 19–32)
CREATININE: 0.96 mg/dL (ref 0.40–1.50)
Calcium: 9.6 mg/dL (ref 8.4–10.5)
GFR: 96.94 mL/min (ref >60.00)
GLUCOSE: 81 mg/dL (ref 70–99)
Potassium: 4.2 mEq/L (ref 3.5–5.1)
Sodium: 137 mEq/L (ref 135–145)
Total Bilirubin: 0.6 mg/dL (ref 0.2–1.2)
Total Protein: 6.9 g/dL (ref 6.0–8.3)

## 2018-02-13 ENCOUNTER — Ambulatory Visit: Payer: Managed Care, Other (non HMO)

## 2018-03-17 ENCOUNTER — Other Ambulatory Visit: Payer: Self-pay | Admitting: Family Medicine

## 2018-03-17 NOTE — Telephone Encounter (Signed)
Copied from CRM [id number]. Topic: Quick Communication - Rx Refill/Question >> Mar 17, 2018  4:02 PM Angela Nevin wrote: Medication: escitalopram (LEXAPRO) 20 MG tablet   Patient is requesting a refill of this medication.   Preferred Pharmacy (with phone number or street name): [facility] [address] [place] - [address]  [phone] (Phone) [phone] (Fax)

## 2018-03-18 ENCOUNTER — Encounter: Payer: Self-pay | Admitting: Family Medicine

## 2018-03-18 ENCOUNTER — Ambulatory Visit: Payer: Managed Care, Other (non HMO) | Admitting: Family Medicine

## 2018-03-18 VITALS — BP 124/86 | HR 84 | Temp 97.5°F | Ht 64.0 in | Wt 209.4 lb

## 2018-03-18 DIAGNOSIS — J019 Acute sinusitis, unspecified: Secondary | ICD-10-CM | POA: Diagnosis not present

## 2018-03-18 MED ORDER — ESCITALOPRAM OXALATE 20 MG PO TABS: 20.0000 mg | ORAL_TABLET | Freq: Every day | ORAL | 0 refills | Status: DC

## 2018-03-18 MED ORDER — FLUTICASONE PROPIONATE 50 MCG/ACT NA SUSP: 2.0000 | Freq: Every day | NASAL | 6 refills | Status: DC

## 2018-03-18 MED ORDER — AMOXICILLIN-POT CLAVULANATE 875-125 MG PO TABS: 1.0000 | ORAL_TABLET | Freq: Two times a day (BID) | ORAL | 0 refills | Status: DC

## 2018-03-18 NOTE — Patient Instructions (Signed)

## 2018-03-18 NOTE — Telephone Encounter (Signed)
Requested Prescriptions  Pending Prescriptions Disp Refills  . escitalopram (LEXAPRO) 20 MG tablet 90 tablet 0    Sig: Take 1 tablet (20 mg total) by mouth daily.     Psychiatry:  Antidepressants - SSRI Passed - 03/17/2018  5:53 PM      Passed - Completed PHQ-2 or PHQ-9 in the last 360 days.      Passed - Valid encounter within last 6 months    Recent Outpatient Visits          1 month ago Essential hypertension   [facility] SwinkSonnenberg, Yehuda MaoEric G, MD   4 months ago Sinusitis, unspecified chronicity, unspecified location   [facility] [place] McLean-Scocuzza, Pasty Spillersracy N, MD   7 months ago Attention deficit   [facility] [place] Glori LuisSonnenberg, Eric G, MD   1 year ago Attention deficit   [facility] [place] Glori LuisSonnenberg, Eric G, MD   1 year ago Elevated BP without diagnosis of hypertension   [facility] [place] Sonn[place], Yehuda MaoEric G, MD      Future Appointments            Today Guse, Janna ArchLauren M, FNP [facility] [place], [facility]   In 2 months Birdie SonsSonnenberg, Yehuda MaoEric G, MD [facility] [place]

## 2018-03-18 NOTE — Progress Notes (Signed)
   Subjective:    Patient ID: Craig Shelton, male    DOB: 03/07/1986, 32 y.o.   MRN: [id number]  HPI  Patient presents to clinic c/o sinus congestion and pressure for over 2 weeks.  Feels pressure in the face around eyes and above teeth.  Also notes thick nasal drainage.  Denies fever or chills.  Denies shortness of breath, cough or wheezing.  Denies nausea, vomiting or diarrhea.  Patient Active Problem List   Diagnosis Date Noted  . Attention deficit 07/22/2017  . Chronic low back pain 07/22/2017  . Anxiety and depression 12/31/2016  . Benign essential HTN 12/31/2016  . Tobacco abuse 12/31/2016   Social History   Tobacco Use  . Smoking status: Current Every Day Smoker  . Smokeless tobacco: Never Used  . Tobacco comment: smokes half a pack daily   Substance Use Topics  . Alcohol use: Yes   Review of Systems  Constitutional: Negative for chills, fatigue and fever.  HENT: +congestion Eyes: Negative.   Respiratory: Negative for cough, shortness of breath and wheezing.   Cardiovascular: Negative for chest pain, palpitations and leg swelling.  Gastrointestinal: Negative for abdominal pain, diarrhea, nausea and vomiting.  Genitourinary: Negative for dysuria, frequency and urgency.  Musculoskeletal: Negative for arthralgias and myalgias.  Skin: Negative for color change, pallor and rash.  Neurological: Negative for syncope, light-headedness and headaches.  Psychiatric/Behavioral: The patient is not nervous/anxious.       Objective:   Physical Exam  Constitutional: He is oriented to person, place, and time. He appears well-nourished. No distress.  HENT:  Head: Normocephalic and atraumatic.  Nose: Mucosal edema and rhinorrhea present. Right sinus exhibits maxillary sinus tenderness and frontal sinus tenderness. Left sinus exhibits maxillary sinus tenderness and frontal sinus tenderness.  Eyes: Conjunctivae and EOM are normal. No scleral icterus.  Neck: Neck supple. No tracheal  deviation present.  Cardiovascular: Normal rate and regular rhythm.  Pulmonary/Chest: Effort normal and breath sounds normal. No respiratory distress.  Musculoskeletal: He exhibits no edema.  Gait normal  Lymphadenopathy:    He has no cervical adenopathy.  Neurological: He is alert and oriented to person, place, and time.  Skin: Skin is warm and dry. No pallor.  Psychiatric: He has a normal mood and affect. His behavior is normal.  Nursing note and vitals reviewed.     Vitals:   03/18/18 1137  BP: 124/86  Pulse: 84  Temp: (!) 97.5 F (36.4 C)  SpO2: 96%   Assessment & Plan:   Acute sinusitis-patient will take Augmentin twice daily for 10 days.  He will also begin using Flonase nasal spray to help open up congestion and get things draining.  Advised to rest, do good handwashing, increase fluid intake.  Patient will keep regularly scheduled follow-up with PCP as planned.  Return to clinic sooner if any issues arise.

## 2018-05-21 ENCOUNTER — Encounter: Payer: Self-pay | Admitting: Family Medicine

## 2018-05-21 ENCOUNTER — Ambulatory Visit: Payer: Managed Care, Other (non HMO) | Admitting: Family Medicine

## 2018-05-21 DIAGNOSIS — Z8679 Personal history of other diseases of the circulatory system: Secondary | ICD-10-CM | POA: Diagnosis not present

## 2018-05-21 DIAGNOSIS — F32A Depression, unspecified: Secondary | ICD-10-CM

## 2018-05-21 DIAGNOSIS — F329 Major depressive disorder, single episode, unspecified: Secondary | ICD-10-CM | POA: Diagnosis not present

## 2018-05-21 DIAGNOSIS — F419 Anxiety disorder, unspecified: Secondary | ICD-10-CM | POA: Diagnosis not present

## 2018-05-21 NOTE — Assessment & Plan Note (Signed)
BP has been in the normal range recently.  We will monitor in the office.

## 2018-05-21 NOTE — Progress Notes (Signed)
  Marikay Alar, MD Phone: [phone]  Craig Shelton is a 33 y.o. male who presents today for follow-up.  CC: Anxiety/depression, elevated blood pressure  Anxiety/depression: Notes this has worsened recently.  There is been no specific change to cause this.  Work is stressful though that is not anything different than usual.  He is on Lexapro.  He notes no SI.  Elevated blood pressure: He has a history of this in the past.  Recently his blood pressures been in the normal range.  No chest pain, shortness breath, or edema.  Social History   Tobacco Use  Smoking Status Current Every Day Smoker  Smokeless Tobacco Never Used  Tobacco Comment   smokes half a pack daily      ROS see history of present illness  Objective  Physical Exam Vitals:   05/21/18 1603  BP: 120/84  Pulse: (!) 102  Temp: 98.4 F (36.9 C)  SpO2: 98%    BP Readings from Last 3 Encounters:  05/21/18 120/84  03/18/18 124/86  01/29/18 (!) 148/90   Wt Readings from Last 3 Encounters:  05/21/18 208 lb (94.3 kg)  03/18/18 209 lb 6.4 oz (95 kg)  01/29/18 204 lb 9.6 oz (92.8 kg)    Physical Exam Constitutional:      General: He is not in acute distress.    Appearance: He is not diaphoretic.  Cardiovascular:     Rate and Rhythm: Normal rate and regular rhythm.     Heart sounds: Normal heart sounds.  Pulmonary:     Effort: Pulmonary effort is normal.     Breath sounds: Normal breath sounds.  Skin:    General: Skin is warm and dry.  Neurological:     Mental Status: He is alert.      Assessment/Plan: Please see individual problem list.  Anxiety and depression We will transition to Zoloft.  I will discuss with our clinical pharmacist the best way to do this.  History of hypertension BP has been in the normal range recently.  We will monitor in the office.    No orders of the defined types were placed in this encounter.   No orders of the defined types were placed in this  encounter.    Marikay Alar, MD [facility]

## 2018-05-21 NOTE — Assessment & Plan Note (Signed)
We will transition to Zoloft.  I will discuss with our clinical pharmacist the best way to do this.

## 2018-05-21 NOTE — Patient Instructions (Signed)
Nice to see you. We will contact you with our clinical pharmacist recommendations.

## 2018-05-23 ENCOUNTER — Telehealth: Payer: Self-pay | Admitting: Family Medicine

## 2018-05-23 NOTE — Telephone Encounter (Signed)
Please let the patient know that I heard back from our clinical pharmacist.  She noted that he could discontinue the escitalopram and then start the sertraline 50 mg daily at the next day.  If he is willing to proceed with this I can send this to his pharmacy.

## 2018-05-23 NOTE — Telephone Encounter (Signed)
-----   Message from Lourena Simmonds, [facility] sent at 05/22/2018  1:31 PM EST ----- Hi! Since they're both SSRIs, I'd stop escitalopram and start sertraline at 50 mg daily, with the option to titrate later as necessary.   And no, no issues with sperm count. However, SNRIs, (except venlafaxine, so just duloxetine) have less of an issues with decreasing libido than SSRIs. If that's a concern/issue for him, you could try duloxetine 30 mg x1 week then 60 mg daily.   Catie ----- Message ----- From: Glori Luis, MD Sent: 05/21/2018   4:36 PM EST To: Lourena Simmonds, [facility]  Hey Catie,   This patient is currently on lexapro 20 mg daily. He is not having much benefit from this. I would like to switch him to zoloft. Would it be best to taper down on the lexapro to 10 mg daily for 1-2 weeks and then switch to zoloft or should he come all the way off the lexapro then go on to the zoloft? Also he wanted to make sure the zoloft would not effect his sperm count. I could not find anything in uptodate to indicate that this could happen though wanted to check. Thanks.   Minerva Areola

## 2018-05-26 MED ORDER — SERTRALINE HCL 50 MG PO TABS: 50.0000 mg | ORAL_TABLET | Freq: Every day | ORAL | 3 refills | Status: DC

## 2018-05-26 NOTE — Telephone Encounter (Signed)
Patient wants to start sertraline 50mg . Please call him once sent to pharmacy, [facility] [address] in [place].

## 2018-05-26 NOTE — Telephone Encounter (Signed)
This has been sent to the pharmacy.  He should start the sertraline the day after discontinuing his Lexapro.

## 2018-05-26 NOTE — Telephone Encounter (Signed)
Called patient and left a VM to call back. CRM created and sent to [facility] pool.  

## 2018-05-26 NOTE — Telephone Encounter (Signed)
Sent to PCP ?

## 2018-05-26 NOTE — Addendum Note (Signed)
Addended by: Glori Luis on: 05/26/2018 06:48 PM   Modules accepted: Orders

## 2018-05-27 NOTE — Telephone Encounter (Signed)
Called patient and left a VM to call back.  

## 2018-05-27 NOTE — Telephone Encounter (Signed)
Pt called back and he was informed that he would need to start the Sertraline the day after he discontinues the Lexapro. Pt gave a verbal understanding.

## 2018-07-18 ENCOUNTER — Ambulatory Visit: Payer: Managed Care, Other (non HMO) | Admitting: Family Medicine

## 2018-07-18 ENCOUNTER — Telehealth: Payer: Self-pay

## 2018-07-18 ENCOUNTER — Other Ambulatory Visit: Payer: Self-pay

## 2018-07-18 ENCOUNTER — Encounter: Payer: Self-pay | Admitting: Family Medicine

## 2018-07-18 VITALS — BP 122/82 | HR 91 | Temp 98.1°F | Resp 18 | Ht 64.0 in | Wt 205.6 lb

## 2018-07-18 DIAGNOSIS — F419 Anxiety disorder, unspecified: Secondary | ICD-10-CM | POA: Diagnosis not present

## 2018-07-18 DIAGNOSIS — F329 Major depressive disorder, single episode, unspecified: Secondary | ICD-10-CM

## 2018-07-18 DIAGNOSIS — R454 Irritability and anger: Secondary | ICD-10-CM | POA: Diagnosis not present

## 2018-07-18 DIAGNOSIS — J019 Acute sinusitis, unspecified: Secondary | ICD-10-CM | POA: Diagnosis not present

## 2018-07-18 MED ORDER — CITALOPRAM HYDROBROMIDE 20 MG PO TABS: 20.0000 mg | ORAL_TABLET | Freq: Every day | ORAL | 3 refills | Status: DC

## 2018-07-18 MED ORDER — DOXYCYCLINE HYCLATE 100 MG PO TABS: 100.0000 mg | ORAL_TABLET | Freq: Two times a day (BID) | ORAL | 0 refills | Status: DC

## 2018-07-18 NOTE — Telephone Encounter (Signed)
I told patient I sent in doxycyline, which was sent, due to him taking the augmentin last time for sinuses.   Correct med sent

## 2018-07-18 NOTE — Patient Instructions (Signed)
Take a break from flonase. Try saline nasal mist instead for the next few weeks. Also take allergy medication like Claritin to reduce allergy symptoms.  Sinusitis, Adult Sinusitis is soreness and swelling (inflammation) of your sinuses. Sinuses are hollow spaces in the bones around your face. They are located:  Around your eyes.  In the middle of your forehead.  Behind your nose.  In your cheekbones. Your sinuses and nasal passages are lined with a fluid called mucus. Mucus drains out of your sinuses. Swelling can trap mucus in your sinuses. This lets germs (bacteria, virus, or fungus) grow, which leads to infection. Most of the time, this condition is caused by a virus. What are the causes? This condition is caused by:  Allergies.  Asthma.  Germs.  Things that block your nose or sinuses.  Growths in the nose (nasal polyps).  Chemicals or irritants in the air.  Fungus (rare). What increases the risk? You are more likely to develop this condition if:  You have a weak body defense system (immune system).  You do a lot of swimming or diving.  You use nasal sprays too much.  You smoke. What are the signs or symptoms? The main symptoms of this condition are pain and a feeling of pressure around the sinuses. Other symptoms include:  Stuffy nose (congestion).  Runny nose (drainage).  Swelling and warmth in the sinuses.  Headache.  Toothache.  A cough that may get worse at night.  Mucus that collects in the throat or the back of the nose (postnasal drip).  Being unable to smell and taste.  Being very tired (fatigue).  A fever.  Sore throat.  Bad breath. How is this diagnosed? This condition is diagnosed based on:  Your symptoms.  Your medical history.  A physical exam.  Tests to find out if your condition is short-term (acute) or long-term (chronic). Your doctor may: ? Check your nose for growths (polyps). ? Check your sinuses using a tool that has  a light (endoscope). ? Check for allergies or germs. ? Do imaging tests, such as an MRI or CT scan. How is this treated? Treatment for this condition depends on the cause and whether it is short-term or long-term.  If caused by a virus, your symptoms should go away on their own within 10 days. You may be given medicines to relieve symptoms. They include: ? Medicines that shrink swollen tissue in the nose. ? Medicines that treat allergies (antihistamines). ? A spray that treats swelling of the nostrils. ? Rinses that help get rid of thick mucus in your nose (nasal saline washes).  If caused by bacteria, your doctor may wait to see if you will get better without treatment. You may be given antibiotic medicine if you have: ? A very bad infection. ? A weak body defense system.  If caused by growths in the nose, you may need to have surgery. Follow these instructions at home: Medicines  Take, use, or apply over-the-counter and prescription medicines only as told by your doctor. These may include nasal sprays.  If you were prescribed an antibiotic medicine, take it as told by your doctor. Do not stop taking the antibiotic even if you start to feel better. Hydrate and humidify   Drink enough water to keep your pee (urine) pale yellow.  Use a cool mist humidifier to keep the humidity level in your home above 50%.  Breathe in steam for 10-15 minutes, 3-4 times a day, or as told by  your doctor. You can do this in the bathroom while a hot shower is running.  Try not to spend time in cool or dry air. Rest  Rest as much as you can.  Sleep with your head raised (elevated).  Make sure you get enough sleep each night. General instructions   Put a warm, moist washcloth on your face 3-4 times a day, or as often as told by your doctor. This will help with discomfort.  Wash your hands often with soap and water. If there is no soap and water, use hand sanitizer.  Do not smoke. Avoid being  around people who are smoking (secondhand smoke).  Keep all follow-up visits as told by your doctor. This is important. Contact a doctor if:  You have a fever.  Your symptoms get worse.  Your symptoms do not get better within 10 days. Get help right away if:  You have a very bad headache.  You cannot stop throwing up (vomiting).  You have very bad pain or swelling around your face or eyes.  You have trouble seeing.  You feel confused.  Your neck is stiff.  You have trouble breathing. Summary  Sinusitis is swelling of your sinuses. Sinuses are hollow spaces in the bones around your face.  This condition is caused by tissues in your nose that become inflamed or swollen. This traps germs. These can lead to infection.  If you were prescribed an antibiotic medicine, take it as told by your doctor. Do not stop taking it even if you start to feel better.  Keep all follow-up visits as told by your doctor. This is important. This information is not intended to replace advice given to you by your health care provider. Make sure you discuss any questions you have with your health care provider. Document Released: 10/10/2007 Document Revised: 09/23/2017 Document Reviewed: 09/23/2017 Elsevier Interactive Patient Education  2019 [employer].

## 2018-07-18 NOTE — Telephone Encounter (Signed)
Copied from CRM [id number]. Topic: General - Other >> Jul 18, 2018  1:53 PM Jaquita Rector A wrote: Reason for CRM: Patient called to say that Rx's were received at the pharmacy but not the amoxicillin-clavulanate (AUGMENTIN) 875-125 MG tablet. Said that the doctor was supposed to send an Rx for amoxicillin-clavulanate (AUGMENTIN) 875-125 MG tablet. Please call patient when Rx is sent Ph# [phone]

## 2018-07-18 NOTE — Telephone Encounter (Signed)
Called pt and left a detailed VM with [name]'s message advise pt to call back if they have any further questions or concerns.

## 2018-07-18 NOTE — Telephone Encounter (Signed)
Pt was seen today [name] can you send in this Rx

## 2018-07-18 NOTE — Progress Notes (Signed)
Subjective:    Patient ID: Craig Shelton, male    DOB: 08/24/1985, 33 y.o.   MRN: [id number]  HPI   Patient presents to clinic complaining of sinus pain across forehead and above teeth, thick sinus drainage, congestion and feelings of drainage down back of throat for the past 2 weeks.  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  Has been prescribed Flonase in the past, but states only use Flonase the past 2 days without much help in reducing symptoms.  Does not take any sort of oral allergy antihistamine right now.  Denies fever or chills.  Denies body aches.  Denies shortness of breath or wheezing.  Patient also complains of increased anxiety and irritability.  Currently on Zoloft 50 mg, but does not think this is helping.  Patient states he had been going to therapy previously and did think that was helping, but has not been able to go to therapy recently.  Denies any SI or HI.  Patient Active Problem List   Diagnosis Date Noted  . Attention deficit 07/22/2017  . Chronic low back pain 07/22/2017  . Anxiety and depression 12/31/2016  . History of hypertension 12/31/2016  . Tobacco abuse 12/31/2016   Social History   Tobacco Use  . Smoking status: Current Every Day Smoker  . Smokeless tobacco: Never Used  . Tobacco comment: smokes half a pack daily   Substance Use Topics  . Alcohol use: Yes   Review of Systems   Constitutional: Negative for chills, fatigue and fever.  HENT: +sinus pain, drainage, sinus headache.   Eyes: Negative.   Respiratory: +cough. Negative for shortness of breath and wheezing.   Cardiovascular: Negative for chest pain, palpitations and leg swelling.  Gastrointestinal: Negative for abdominal pain, diarrhea, nausea and vomiting.  Genitourinary: Negative for dysuria, frequency and urgency.  Musculoskeletal: Negative for arthralgias and myalgias.  Skin: Negative for color change, pallor and rash.   Neurological: Negative for syncope, light-headedness and headaches.  Psychiatric/Behavioral: +increased irritability and anxiety     Objective:   Physical Exam Vitals signs and nursing note reviewed.  Constitutional:      General: He is not in acute distress.    Appearance: He is not ill-appearing, toxic-appearing or diaphoretic.  HENT:     Head: Normocephalic and atraumatic.     Ears:     Comments: +fullness bilateral TMs    Nose: Congestion and rhinorrhea present.     Mouth/Throat:     Mouth: Mucous membranes are moist.     Pharynx: No oropharyngeal exudate or posterior oropharyngeal erythema.     Comments: +post nasal drainage Eyes:     General: No scleral icterus.       Right eye: No discharge.        Left eye: No discharge.     Extraocular Movements: Extraocular movements intact.     Pupils: Pupils are equal, round, and reactive to light.  Neck:     Musculoskeletal: Neck supple. No neck rigidity.  Cardiovascular:     Rate and Rhythm: Normal rate and regular rhythm.  Pulmonary:     Effort: Pulmonary effort is normal. No respiratory distress.     Breath sounds: Normal breath sounds.  Lymphadenopathy:     Cervical: No cervical adenopathy.  Skin:    General: Skin is warm and dry.     Coloration: Skin is not jaundiced or pale.  Neurological:     Mental  Status: He is alert and oriented to person, place, and time.  Psychiatric:        Mood and Affect: Mood normal.        Behavior: Behavior normal.        Thought Content: Thought content normal.        Judgment: Judgment normal.    Vitals:   07/18/18 1023  BP: 122/82  Pulse: 91  Resp: 18  Temp: 98.1 F (36.7 C)  SpO2: 95%       Assessment & Plan:   Acute sinusitis-patient symptoms are concerning for sinus infection.  He will take doxycycline twice daily for 10 days.  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  Discussed saline nasal rinses to help reduce congestion.  Anxiety/irritability-patient will wean off of Zoloft by taking 1/2 tablet daily for the next week and then he will start Celexa 20 mg daily.  Encouraged to reconsider resuming therapy sessions to help with different strategies to manage his anxiety and irritability in addition to medication.  Patient will follow-up here in 4 to 6 weeks for recheck on mood and irritability after changing medications.

## 2018-07-19 DIAGNOSIS — R454 Irritability and anger: Secondary | ICD-10-CM | POA: Insufficient documentation

## 2018-08-20 ENCOUNTER — Ambulatory Visit (INDEPENDENT_AMBULATORY_CARE_PROVIDER_SITE_OTHER): Payer: Managed Care, Other (non HMO) | Admitting: Family Medicine

## 2018-08-20 ENCOUNTER — Ambulatory Visit: Payer: Managed Care, Other (non HMO) | Admitting: Family Medicine

## 2018-08-20 ENCOUNTER — Telehealth: Payer: Self-pay | Admitting: Family Medicine

## 2018-08-20 ENCOUNTER — Other Ambulatory Visit: Payer: Self-pay

## 2018-08-20 ENCOUNTER — Encounter: Payer: Self-pay | Admitting: Family Medicine

## 2018-08-20 DIAGNOSIS — Z8679 Personal history of other diseases of the circulatory system: Secondary | ICD-10-CM | POA: Diagnosis not present

## 2018-08-20 DIAGNOSIS — F329 Major depressive disorder, single episode, unspecified: Secondary | ICD-10-CM | POA: Diagnosis not present

## 2018-08-20 DIAGNOSIS — F419 Anxiety disorder, unspecified: Secondary | ICD-10-CM | POA: Diagnosis not present

## 2018-08-20 DIAGNOSIS — F32A Depression, unspecified: Secondary | ICD-10-CM

## 2018-08-20 DIAGNOSIS — G479 Sleep disorder, unspecified: Secondary | ICD-10-CM

## 2018-08-20 NOTE — Telephone Encounter (Signed)
Called and spoke with pt. Pt has been scheduled for 6 month follow up.

## 2018-08-20 NOTE — Assessment & Plan Note (Signed)
Improved.  He will continue on Celexa.

## 2018-08-20 NOTE — Progress Notes (Signed)
Virtual Visit via video Note  This visit type was conducted due to national recommendations for restrictions regarding the COVID-19 pandemic (e.g. social distancing).  This format is felt to be most appropriate for this patient at this time.  All issues noted in this document were discussed and addressed.  No physical exam was performed (except for noted visual exam findings with Video Visits).   I connected with Ladon ApplebaumAlan Lamb on 08/20/18 at  2:45 PM EDT by a video enabled telemedicine application and verified that I am speaking with the correct person using two identifiers. Location patient: home Location provider: work Persons participating in the virtual visit: patient, provider  I discussed the limitations, risks, security and privacy concerns of performing an evaluation and management service by telephone and the availability of in person appointments. I also discussed with the patient that there may be a patient responsible charge related to this service. The patient expressed understanding and agreed to proceed.   Reason for visit: follow-up  HPI: Anxiety/depression: He notes no significant symptoms with this.  He was switched to Celexa and has done well with this.  He is not nearly as irritable.  No SI.  He has not been going to work out of his own choice to help protect himself and his family from coronavirus.  He notes he is a little nervous to go back potentially in May.  History of hypertension: Most recently has been around 135/80.  No chest pain or shortness of breath.  He is staying active with work and climbing trees and doing house projects.  He is eating healthy food at home with vegetables and home-cooked meals.  He is not eating any junk food.  Difficulty sleeping: Patient notes for some time now he has had issues with falling asleep.  He does note he eats late typically within an hour of going to bed.  He does try to avoid his phone though does watch some TV.  He drinks 4-5  caffeinated beverages daily with the last being around 4-5 pM.  No alcohol intake.   ROS: See pertinent positives and negatives per HPI.  Past Medical History:  Diagnosis Date  . Concussion     No past surgical history on file.  Family History  Problem Relation Age of Onset  . Colon cancer Mother   . Prostate cancer Maternal Grandfather   . Stomach cancer Paternal Grandmother   . Prostate cancer Paternal Grandfather     SOCIAL HX: smoker   Current Outpatient Medications:  .  citalopram (CELEXA) 20 MG tablet, Take 1 tablet (20 mg total) by mouth daily., Disp: 30 tablet, Rfl: 3  EXAM:  VITALS per patient if applicable: None  GENERAL: alert, oriented, appears well and in no acute distress  HEENT: atraumatic, conjunttiva clear, no obvious abnormalities on inspection of external nose and ears  NECK: normal movements of the head and neck  LUNGS: on inspection no signs of respiratory distress, breathing rate appears normal, no obvious gross SOB, gasping or wheezing  CV: no obvious cyanosis  MS: moves all visible extremities without noticeable abnormality  PSYCH/NEURO: pleasant and cooperative, no obvious depression or anxiety, speech and thought processing grossly intact  ASSESSMENT AND PLAN:  Discussed the following assessment and plan:  Anxiety and depression  Sleeping difficulty  History of hypertension  Anxiety and depression Improved.  He will continue on Celexa.  Sleeping difficulty Discussed sleep hygiene.  Discussed decreasing caffeine intake and limiting it to earlier in the day.  He will monitor and if not improving let us know.  History of hypertension Normal at home.  He will periodically monitor.  Discussed diet and exercise.    I discussed the assessment and treatment plan with the patient. The patient was provided an opportunity to ask questions and all were answered. The patient agreed with the plan and demonstrated an understanding of the  instructions.   The patient was advised to call back or seek an in-person evaluation if the symptoms worsen or if the condition fails to improve as anticipated.   Marikay Alar, MD

## 2018-08-20 NOTE — Assessment & Plan Note (Signed)
Normal at home.  He will periodically monitor.  Discussed diet and exercise.

## 2018-08-20 NOTE — Assessment & Plan Note (Signed)
Discussed sleep hygiene.  Discussed decreasing caffeine intake and limiting it to earlier in the day.  He will monitor and if not improving let us know.

## 2018-08-20 NOTE — Telephone Encounter (Signed)
Please contact the patient and get him set up for follow-up in 6 months.

## 2018-11-14 ENCOUNTER — Telehealth: Payer: Self-pay | Admitting: Family Medicine

## 2018-11-14 ENCOUNTER — Telehealth: Payer: Self-pay

## 2018-11-14 DIAGNOSIS — F419 Anxiety disorder, unspecified: Secondary | ICD-10-CM

## 2018-11-14 DIAGNOSIS — F329 Major depressive disorder, single episode, unspecified: Secondary | ICD-10-CM

## 2018-11-14 NOTE — Telephone Encounter (Signed)
Pt would like refill on

## 2018-11-14 NOTE — Telephone Encounter (Signed)
Copied from [place] [id number]. Topic: Quick Communication - Rx Refill/Question >> Nov 14, 2018  4:48 PM Parke Poisson wrote: Medication:citalopram (CELEXA) 20 MG tablet  Has the patient contacted their pharmacy? Yes  (Agent: If yes, when and what did the pharmacy advise?)Needs approval from MD Preferred Pharmacy (with phone number or street name): [facility] [address] [place] [phone] (Phone) [phone] (Fax)    Agent: Please be advised that RX refills may take up to 3 business days. We ask that you follow-up with your pharmacy.

## 2018-11-20 MED ORDER — CITALOPRAM HYDROBROMIDE 20 MG PO TABS: 20.0000 mg | ORAL_TABLET | Freq: Every day | ORAL | 3 refills | Status: DC

## 2018-11-20 NOTE — Addendum Note (Signed)
Addended by: Nash Mantis F on: 11/20/2018 04:12 PM   Modules accepted: Orders

## 2018-11-20 NOTE — Telephone Encounter (Signed)
Refill sent.  Left message on pt's cell number (ok per DPR).

## 2018-11-20 NOTE — Telephone Encounter (Signed)
Pt is calling about status of rx.  Please call pt since over 3 days.  [phone] Pt is out of meds

## 2019-01-09 ENCOUNTER — Telehealth: Payer: Self-pay | Admitting: Family Medicine

## 2019-01-09 DIAGNOSIS — F32A Depression, unspecified: Secondary | ICD-10-CM

## 2019-01-09 DIAGNOSIS — F329 Major depressive disorder, single episode, unspecified: Secondary | ICD-10-CM

## 2019-01-09 MED ORDER — CITALOPRAM HYDROBROMIDE 20 MG PO TABS: 20.0000 mg | ORAL_TABLET | Freq: Every day | ORAL | 3 refills | Status: DC

## 2019-01-09 NOTE — Telephone Encounter (Signed)
Refill sent.

## 2019-01-09 NOTE — Telephone Encounter (Signed)
Patient is calling to request an increase in  citalopram (CELEXA) 20 MG tablet [[id number]]    Please advise Cb- [phone]  Preferred [facility].

## 2019-02-16 ENCOUNTER — Other Ambulatory Visit: Payer: Self-pay

## 2019-02-16 ENCOUNTER — Encounter: Payer: Self-pay | Admitting: Family Medicine

## 2019-02-16 ENCOUNTER — Ambulatory Visit (INDEPENDENT_AMBULATORY_CARE_PROVIDER_SITE_OTHER): Payer: Managed Care, Other (non HMO) | Admitting: Family Medicine

## 2019-02-16 VITALS — Ht 64.0 in | Wt 200.0 lb

## 2019-02-16 DIAGNOSIS — Z3009 Encounter for other general counseling and advice on contraception: Secondary | ICD-10-CM | POA: Diagnosis not present

## 2019-02-16 DIAGNOSIS — F329 Major depressive disorder, single episode, unspecified: Secondary | ICD-10-CM

## 2019-02-16 DIAGNOSIS — F419 Anxiety disorder, unspecified: Secondary | ICD-10-CM | POA: Diagnosis not present

## 2019-02-16 DIAGNOSIS — Z8679 Personal history of other diseases of the circulatory system: Secondary | ICD-10-CM

## 2019-02-16 MED ORDER — CITALOPRAM HYDROBROMIDE 40 MG PO TABS: 40.0000 mg | ORAL_TABLET | Freq: Every day | ORAL | 1 refills | Status: DC

## 2019-02-16 NOTE — Assessment & Plan Note (Signed)
Referral to urology

## 2019-02-16 NOTE — Progress Notes (Signed)
Virtual Visit via video Note  This visit type was conducted due to national recommendations for restrictions regarding the COVID-19 pandemic (e.g. social distancing).  This format is felt to be most appropriate for this patient at this time.  All issues noted in this document were discussed and addressed.  No physical exam was performed (except for noted visual exam findings with Video Visits).   I connected with Craig Shelton today at  4:00 PM EDT by a video enabled telemedicine application or telephone and verified that I am speaking with the correct person using two identifiers. Location patient: home Location provider: work Persons participating in the virtual visit: patient, provider  I discussed the limitations, risks, security and privacy concerns of performing an evaluation and management service by telephone and the availability of in person appointments. I also discussed with the patient that there may be a patient responsible charge related to this service. The patient expressed understanding and agreed to proceed.   Reason for visit: follow-up  HPI: Anxiety/depression: Patient notes this worsened to a certain degree over the last 7 to 8 weeks.  He notes it has been a little crazy over that timeframe.  His wife was pregnant and developed preeclampsia at 27 weeks and ended up having to deliver around that time as well.  His son is still in the NICU though seems to be doing quite well.  His wife is doing quite well.  Though this was quite stressful.  Also this is around the time his mother passed away about 4 years ago and he had several friends pass away at a similar time a year after his mom passed away.  He notes the Celexa has worked well for the most part.  No SI.  History of hypertension: Patient reports his blood pressures been good.  He feels like it was previously elevated related to nerves and anxiety.  He notes no chest pain or shortness of breath.  He does not know the numbers  of his blood pressure recently.  Vasectomy evaluation: Patient reports he wants to have a vasectomy.  He notes he has almost lost his wife and his sons each time his wife has been pregnant and he does not want a chance that occurring in the future.   ROS: See pertinent positives and negatives per HPI.  Past Medical History:  Diagnosis Date  . Concussion     No past surgical history on file.  Family History  Problem Relation Age of Onset  . Colon cancer Mother   . Prostate cancer Maternal Grandfather   . Stomach cancer Paternal Grandmother   . Prostate cancer Paternal Grandfather     SOCIAL HX: Smoker   Current Outpatient Medications:  .  citalopram (CELEXA) 40 MG tablet, Take 1 tablet (40 mg total) by mouth daily., Disp: 90 tablet, Rfl: 1  EXAM:  VITALS per patient if applicable: None.  GENERAL: alert, oriented, appears well and in no acute distress  HEENT: atraumatic, conjunttiva clear, no obvious abnormalities on inspection of external nose and ears  NECK: normal movements of the head and neck  LUNGS: on inspection no signs of respiratory distress, breathing rate appears normal, no obvious gross SOB, gasping or wheezing  CV: no obvious cyanosis  MS: moves all visible extremities without noticeable abnormality  PSYCH/NEURO: pleasant and cooperative, no obvious depression or anxiety, speech and thought processing grossly intact  ASSESSMENT AND PLAN:  Discussed the following assessment and plan:  Anxiety and depression Worsened recently with  the issues his wife had with her pregnancy and his son being born early.  Discussed increasing Celexa.  He will follow-up in 2 months for recheck.  He will contact us sooner if he has issues or worsening depression.  History of hypertension Reports adequately controlled at home.  He will monitor periodically and if trending up he will let us know.  Vasectomy evaluation Referral to urology.    I discussed the assessment  and treatment plan with the patient. The patient was provided an opportunity to ask questions and all were answered. The patient agreed with the plan and demonstrated an understanding of the instructions.   The patient was advised to call back or seek an in-person evaluation if the symptoms worsen or if the condition fails to improve as anticipated.   Tommi Rumps, MD

## 2019-02-16 NOTE — Assessment & Plan Note (Signed)
Reports adequately controlled at home.  He will monitor periodically and if trending up he will let us know.

## 2019-02-16 NOTE — Assessment & Plan Note (Signed)
Worsened recently with the issues his wife had with her pregnancy and his son being born early.  Discussed increasing Celexa.  He will follow-up in 2 months for recheck.  He will contact us sooner if he has issues or worsening depression.

## 2019-02-17 ENCOUNTER — Telehealth: Payer: Self-pay | Admitting: Family Medicine

## 2019-02-17 NOTE — Telephone Encounter (Signed)
I called pt and left vm to call ofc to sch Return in about 2 months (around 04/18/2019) for depression .

## 2019-03-12 ENCOUNTER — Ambulatory Visit: Payer: Self-pay | Admitting: Urology

## 2019-05-14 ENCOUNTER — Other Ambulatory Visit: Payer: Self-pay

## 2019-05-14 ENCOUNTER — Encounter: Payer: Self-pay | Admitting: Urology

## 2019-05-14 ENCOUNTER — Ambulatory Visit: Payer: Managed Care, Other (non HMO) | Admitting: Urology

## 2019-05-14 VITALS — BP 122/88 | HR 114 | Ht 65.0 in | Wt 200.0 lb

## 2019-05-14 DIAGNOSIS — Z3009 Encounter for other general counseling and advice on contraception: Secondary | ICD-10-CM | POA: Diagnosis not present

## 2019-05-14 MED ORDER — DIAZEPAM 5 MG PO TABS: 5.0000 mg | ORAL_TABLET | Freq: Once | ORAL | 0 refills | Status: DC | PRN

## 2019-05-14 NOTE — Progress Notes (Signed)
05/14/19 2:28 PM   Craig Shelton Apr 30, 1986 [id number]  Referring provider: Glori Luis, MD [address] [place],  [place] [address]  CC: Discuss vasectomy  HPI: I saw Craig Shelton in urology clinic in consultation for discussion of vasectomy from Dr. Birdie Sons.  He is a 34 year old male with medical history notable for anxiety/depression who is married with 2 children.  His wife had complicated pregnancies for both children, and they do not want any further pregnancies.  PMH: Anxiety/depression Concussion  Surgical History: No past surgical history on file.  Allergies: No Known Allergies  Family History: Family History  Problem Relation Age of Onset  . Colon cancer Mother   . Prostate cancer Maternal Grandfather   . Stomach cancer Paternal Grandmother   . Prostate cancer Paternal Grandfather     Social History:  reports that he has been smoking. He has never used smokeless tobacco. He reports current alcohol use. He reports that he does not use drugs.  ROS: Please see flowsheet from today's date for complete review of systems.  Physical Exam: BP 122/88   Pulse (!) 114   Ht 5\' 5"  (1.651 m)   Wt 200 lb (90.7 kg)   BMI 33.28 kg/m    Constitutional:  Alert and oriented, No acute distress. Cardiovascular: No clubbing, cyanosis, or edema. Respiratory: Normal respiratory effort, no increased work of breathing. GI: Abdomen is soft, nontender, nondistended, no abdominal masses GU: Testicles descended 20 cc bilaterally, vas deferens easily palpable bilaterally Lymph: No cervical or inguinal lymphadenopathy. Skin: No rashes, bruises or suspicious lesions. Neurologic: Grossly intact, no focal deficits, moving all 4 extremities. Psychiatric: Normal mood and affect.   Assessment & Plan:   In summary, the patient is a 34 year old male with 2 children who does not desire any further biologic pregnancies and is interested in vasectomy for surgical  sterilization.  We discussed the risks and benefits of vasectomy at length.  Vasectomy is intended to be a permanent form of contraception, and does not produce immediate sterility.  Following vasectomy another form of contraception is required until vas occlusion is confirmed by a post-vasectomy semen analysis obtained 2-3 months after the procedure.  Even after vas occlusion is confirmed, vasectomy is not 100% reliable in preventing pregnancy, and the failure rate is approximately 05/1998.  Repeat vasectomy is required in less than 1% of patients.  He should refrain from ejaculation for 1 week after vasectomy.  Options for fertility after vasectomy include vasectomy reversal, and sperm retrieval with in vitro fertilization or ICSI.  These options are not always successful and may be expensive.  Finally, there are other permanent and non-permanent alternatives to vasectomy available. There is no risk of erectile dysfunction, and the volume of semen will be similar to prior, as the majority of the ejaculate is from the prostate and seminal vesicles.   The procedure takes ~20 minutes.  We recommend patients take 5-10 mg of Valium 30 minutes prior, and he will need a driver post-procedure.  Local anesthetic is injected into the scrotal skin and a small segment of the vas deferens is removed, and the ends occluded. The complication rate is approximately 1-2%, and includes bleeding, infection, and development of chronic scrotal pain.  PLAN: Pending insurance approval, will schedule for vasectomy at his convenience   A total of 40 minutes were spent face-to-face with the patient, greater than 50% was spent in patient education, counseling, and coordination of care regarding risks, benefits, and alternatives to vasectomy.  Billey Co, [facility] [address], [place], [place] [address] [phone]

## 2019-05-14 NOTE — Patient Instructions (Signed)

## 2019-06-26 ENCOUNTER — Telehealth: Payer: Self-pay | Admitting: Family Medicine

## 2019-06-26 NOTE — Telephone Encounter (Signed)
Pt called wanting to get a sooner appt then 3/31. He feels that his anxiety medication is not working and is also having back pain

## 2019-06-29 NOTE — Telephone Encounter (Signed)
A 15 minute slot is ok.

## 2019-06-29 NOTE — Telephone Encounter (Signed)
Pt called wanting to get a sooner appt then 3/31. He feels that his anxiety medication is not working and is also having back pain you only have the 15 min slot available. Please advise.  Craig Shelton,cma

## 2019-06-29 NOTE — Telephone Encounter (Signed)
I called to schedule the patient in a 15 min slot per the provider and I left a voicemail for the patient to call back to schedule.  Kortny Lirette,cma

## 2019-06-30 NOTE — Telephone Encounter (Signed)
LVM for the patient to call back and schedule a visit in a 15 min slot per the provider.  Rawlin Reaume,cma

## 2019-07-02 NOTE — Telephone Encounter (Signed)
I called the patient 2 days ago and scheduled an office visit.  Kell Ferris,cma

## 2019-07-03 ENCOUNTER — Encounter: Payer: Self-pay | Admitting: Family Medicine

## 2019-07-03 ENCOUNTER — Other Ambulatory Visit: Payer: Self-pay

## 2019-07-03 ENCOUNTER — Ambulatory Visit (INDEPENDENT_AMBULATORY_CARE_PROVIDER_SITE_OTHER): Payer: Managed Care, Other (non HMO) | Admitting: Family Medicine

## 2019-07-03 VITALS — Ht 65.0 in | Wt 190.0 lb

## 2019-07-03 DIAGNOSIS — Z720 Tobacco use: Secondary | ICD-10-CM

## 2019-07-03 DIAGNOSIS — F329 Major depressive disorder, single episode, unspecified: Secondary | ICD-10-CM

## 2019-07-03 DIAGNOSIS — M5442 Lumbago with sciatica, left side: Secondary | ICD-10-CM | POA: Diagnosis not present

## 2019-07-03 DIAGNOSIS — F419 Anxiety disorder, unspecified: Secondary | ICD-10-CM

## 2019-07-03 DIAGNOSIS — F32A Depression, unspecified: Secondary | ICD-10-CM

## 2019-07-03 MED ORDER — BUPROPION HCL ER (XL) 150 MG PO TB24: 150.0000 mg | ORAL_TABLET | Freq: Every day | ORAL | 3 refills | Status: DC

## 2019-07-03 MED ORDER — PREDNISONE 20 MG PO TABS: 40.0000 mg | ORAL_TABLET | Freq: Every day | ORAL | 0 refills | Status: DC

## 2019-07-03 NOTE — Assessment & Plan Note (Signed)
Ongoing for about 6 weeks.  He does have left-sided radicular symptoms.  We will proceed with an x-ray.  We will treat with prednisone.  Discussed risk of increased appetite, difficulty sleeping, and agitation.  Advised that if he develops agitation with the prednisone he should discontinue it and let us know.

## 2019-07-03 NOTE — Progress Notes (Signed)
Virtual Visit via video Note  This visit type was conducted due to national recommendations for restrictions regarding the COVID-19 pandemic (e.g. social distancing).  This format is felt to be most appropriate for this patient at this time.  All issues noted in this document were discussed and addressed.  No physical exam was performed (except for noted visual exam findings with Video Visits).   I connected with Craig Shelton today at  3:15 PM EST by a video enabled telemedicine application and verified that I am speaking with the correct person using two identifiers. Location patient: home Location provider: work Persons participating in the virtual visit: patient, provider  I discussed the limitations, risks, security and privacy concerns of performing an evaluation and management service by telephone and the availability of in person appointments. I also discussed with the patient that there may be a patient responsible charge related to this service. The patient expressed understanding and agreed to proceed.  Reason for visit: follow-up  HPI: Anxiety/depression: Patient notes both of these things seem to be worse recently.  There is nothing that has changed in his life.  He feels like the Celexa works well some days and then other days it does not work at all.  No SI.  Back pain: Patient notes lumbar and thoracic back discomfort for the last 6 or so weeks.  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  He does have radiation with shooting pain down his left leg and numbness at times.  Occurs randomly.  No weakness.  No incontinence.   ROS: See pertinent positives and negatives per HPI.  Past Medical History:  Diagnosis Date  . Concussion     No past surgical history on file.  Family History  Problem Relation Age of Onset  . Colon cancer Mother   . Prostate cancer Maternal Grandfather    . Stomach cancer Paternal Grandmother   . Prostate cancer Paternal Grandfather     SOCIAL HX: Smoker   Current Outpatient Medications:  .  citalopram (CELEXA) 40 MG tablet, Take 1 tablet (40 mg total) by mouth daily., Disp: 90 tablet, Rfl: 1 .  buPROPion (WELLBUTRIN XL) 150 MG 24 hr tablet, Take 1 tablet (150 mg total) by mouth daily., Disp: 30 tablet, Rfl: 3 .  predniSONE (DELTASONE) 20 MG tablet, Take 2 tablets (40 mg total) by mouth daily with breakfast., Disp: 10 tablet, Rfl: 0  EXAM:  VITALS per patient if applicable:  GENERAL: alert, oriented, appears well and in no acute distress  HEENT: atraumatic, conjunttiva clear, no obvious abnormalities on inspection of external nose and ears  NECK: normal movements of the head and neck  LUNGS: on inspection no signs of respiratory distress, breathing rate appears normal, no obvious gross SOB, gasping or wheezing  CV: no obvious cyanosis  MS: moves all visible extremities without noticeable abnormality  PSYCH/NEURO: pleasant and cooperative, no obvious depression or anxiety, speech and thought processing grossly intact  ASSESSMENT AND PLAN:  Discussed the following assessment and plan:  Anxiety and depression Worsened.  We will trial adding on Wellbutrin.  Discussed that this could help with his smoking as well.  He will continue Celexa.  Follow-up in 6 weeks.  Acute bilateral low back pain with left-sided sciatica Ongoing for about 6 weeks.  He does have left-sided radicular symptoms.  We will proceed with an x-ray.  We will treat with prednisone.  Discussed risk of increased appetite, difficulty sleeping, and agitation.  Advised that if he develops agitation with the prednisone he should discontinue it and let us know.  Tobacco abuse Encourage smoking cessation.  Discussed that Wellbutrin may help him quit.   Orders Placed This Encounter  Procedures  . DG Lumbar Spine Complete    Standing Status:   Future    Standing  Expiration Date:   08/30/2020    Order Specific Question:   Reason for Exam (SYMPTOM  OR DIAGNOSIS REQUIRED)    Answer:   low back pain with radicular symptoms left leg with pain and numbness intermittently    Order Specific Question:   Preferred imaging location?    Answer:   [facility]    Order Specific Question:   Radiology Contrast Protocol - do NOT remove file path    Answer:   [email or web address]    Meds ordered this encounter  Medications  . buPROPion (WELLBUTRIN XL) 150 MG 24 hr tablet    Sig: Take 1 tablet (150 mg total) by mouth daily.    Dispense:  30 tablet    Refill:  3  . predniSONE (DELTASONE) 20 MG tablet    Sig: Take 2 tablets (40 mg total) by mouth daily with breakfast.    Dispense:  10 tablet    Refill:  0     I discussed the assessment and treatment plan with the patient. The patient was provided an opportunity to ask questions and all were answered. The patient agreed with the plan and demonstrated an understanding of the instructions.   The patient was advised to call back or seek an in-person evaluation if the symptoms worsen or if the condition fails to improve as anticipated.   Marikay Alar, MD

## 2019-07-03 NOTE — Assessment & Plan Note (Signed)
Worsened.  We will trial adding on Wellbutrin.  Discussed that this could help with his smoking as well.  He will continue Celexa.  Follow-up in 6 weeks.

## 2019-07-03 NOTE — Assessment & Plan Note (Signed)
Encourage smoking cessation.  Discussed that Wellbutrin may help him quit.

## 2019-07-21 ENCOUNTER — Ambulatory Visit
Admission: RE | Admit: 2019-07-21 | Discharge: 2019-07-21 | Disposition: A | Discharge status: Home / Self Care | Payer: Managed Care, Other (non HMO) | Source: Ambulatory Visit | Attending: Family Medicine | Admitting: Family Medicine

## 2019-07-21 DIAGNOSIS — M5442 Lumbago with sciatica, left side: Secondary | ICD-10-CM | POA: Insufficient documentation

## 2019-07-30 ENCOUNTER — Encounter: Payer: Self-pay | Admitting: Urology

## 2019-08-05 ENCOUNTER — Ambulatory Visit: Payer: Managed Care, Other (non HMO) | Admitting: Urology

## 2019-08-05 ENCOUNTER — Encounter: Payer: Self-pay | Admitting: Urology

## 2019-08-05 ENCOUNTER — Ambulatory Visit: Payer: Managed Care, Other (non HMO) | Admitting: Family Medicine

## 2019-08-05 ENCOUNTER — Other Ambulatory Visit: Payer: Self-pay

## 2019-08-05 VITALS — BP 151/87 | HR 86

## 2019-08-05 DIAGNOSIS — Z3009 Encounter for other general counseling and advice on contraception: Secondary | ICD-10-CM

## 2019-08-05 DIAGNOSIS — Z9852 Vasectomy status: Secondary | ICD-10-CM

## 2019-08-05 NOTE — Addendum Note (Signed)
Addended by: Milas Kocher A on: 08/05/2019 04:29 PM   Modules accepted: Orders

## 2019-08-05 NOTE — Progress Notes (Signed)
VASECTOMY PROCEDURE NOTE:  The patient was taken to the minor procedure room and placed in the supine position. His genitals were prepped and draped in the usual sterile fashion. The right vas deferens was brought up to the skin of the right upper scrotum. The skin overlying it was anesthetized with 1% lidocaine without epinephrine, anesthetic was also injected alongside the vas deferens in the direction of the inguinal canal. The no scalpel vasectomy instrument was used to make a small perforation in the scrotal skin. The vasectomy clamp was used to grasp the vas deferens. It was carefully dissected free from surrounding structures. A 1cm segment of the vas was removed, and the cut ends of the mucosa were cauterized. No significant bleeding was noted. The vas deferens was returned to the scrotum. The skin incision was closed with a simple interrupted stitch of 4-0 chromic.  Attention was then turned to the left side. The left vasectomy was performed in the same exact fashion. Sterile dressings were placed over each incision. The patient tolerated the procedure well.  IMPRESSION/DIAGNOSIS: The patient is a 34 year old gentleman who underwent a vasectomy today. Post-procedure instructions were reviewed. I stressed the importance of continuing to use birth control until he provides a semen specimen more than 2 months from now that demonstrates azoospermia.  We discussed return precautions including fever over 101, significant bleeding or hematoma, or uncontrolled pain. I also stressed the importance of avoiding strenuous activity for one week, no sexual activity or ejaculations for 5 days, intermittent icing over the next 48 hours, and scrotal support.   PLAN: The patient will be advised of his semen analysis results when available.  Legrand Rams, MD 08/05/2019

## 2019-08-05 NOTE — Patient Instructions (Signed)

## 2019-08-31 ENCOUNTER — Telehealth: Payer: Self-pay | Admitting: Family Medicine

## 2019-08-31 DIAGNOSIS — F419 Anxiety disorder, unspecified: Secondary | ICD-10-CM

## 2019-08-31 DIAGNOSIS — F329 Major depressive disorder, single episode, unspecified: Secondary | ICD-10-CM

## 2019-08-31 MED ORDER — CITALOPRAM HYDROBROMIDE 40 MG PO TABS: 40.0000 mg | ORAL_TABLET | Freq: Every day | ORAL | 1 refills | Status: DC

## 2019-08-31 NOTE — Telephone Encounter (Signed)
Pt needs a refill on citalopram (CELEXA) 40 MG tablet sent to [facility]

## 2019-08-31 NOTE — Addendum Note (Signed)
Addended by: Elise Benne T on: 08/31/2019 02:30 PM   Modules accepted: Orders

## 2019-11-03 ENCOUNTER — Telehealth: Payer: Self-pay | Admitting: Family Medicine

## 2019-11-03 MED ORDER — BUPROPION HCL ER (XL) 150 MG PO TB24: 150.0000 mg | ORAL_TABLET | Freq: Every day | ORAL | 3 refills | Status: DC

## 2019-11-03 NOTE — Telephone Encounter (Signed)
Patient needs a refill on his buPROPion (WELLBUTRIN XL) 150 MG 24 hr tablet, patient is requesting a 90 day supply.

## 2019-11-04 ENCOUNTER — Other Ambulatory Visit: Payer: Self-pay

## 2019-11-06 ENCOUNTER — Other Ambulatory Visit: Payer: Managed Care, Other (non HMO)

## 2019-11-06 ENCOUNTER — Other Ambulatory Visit: Payer: Self-pay

## 2019-11-06 DIAGNOSIS — Z9852 Vasectomy status: Secondary | ICD-10-CM

## 2019-11-07 LAB — POST-VAS SPERM EVALUATION,QUAL: Volume: 0.8 mL

## 2019-11-19 ENCOUNTER — Telehealth: Payer: Self-pay

## 2019-11-19 NOTE — Telephone Encounter (Signed)
-----   Message from Sondra Come, MD sent at 11/10/2019  8:27 AM EDT ----- Sperm still present, encourage clearance of sperm and repeat more sensitive semen analysis in 1 month, thanks  Legrand Rams, MD 11/10/2019

## 2019-11-19 NOTE — Telephone Encounter (Signed)
Patient notified and given detailed instruction for timed specimen. [facility] requisition was placed at the front desk for patient to pick up. Patient understands that he will need to follow instructions and call to schedule drop off at a [facility] location not at our office.

## 2020-03-25 ENCOUNTER — Ambulatory Visit (INDEPENDENT_AMBULATORY_CARE_PROVIDER_SITE_OTHER): Payer: 59 | Admitting: Family Medicine

## 2020-03-25 ENCOUNTER — Encounter: Payer: Self-pay | Admitting: Family Medicine

## 2020-03-25 ENCOUNTER — Other Ambulatory Visit: Payer: Self-pay

## 2020-03-25 DIAGNOSIS — Z8679 Personal history of other diseases of the circulatory system: Secondary | ICD-10-CM

## 2020-03-25 DIAGNOSIS — F32A Depression, unspecified: Secondary | ICD-10-CM

## 2020-03-25 DIAGNOSIS — Z720 Tobacco use: Secondary | ICD-10-CM

## 2020-03-25 DIAGNOSIS — F419 Anxiety disorder, unspecified: Secondary | ICD-10-CM

## 2020-03-25 MED ORDER — BUPROPION HCL ER (XL) 150 MG PO TB24: 150.0000 mg | ORAL_TABLET | Freq: Every day | ORAL | 3 refills | Status: DC

## 2020-03-25 MED ORDER — CITALOPRAM HYDROBROMIDE 40 MG PO TABS: ORAL_TABLET | ORAL | 1 refills | Status: DC

## 2020-03-25 NOTE — Assessment & Plan Note (Signed)
Encourage smoking cessation.  Discussed Wellbutrin should help with this.  Discussed he could use nicotine patches, gums, or lozenges as well.

## 2020-03-25 NOTE — Progress Notes (Signed)
Marikay Alar, MD Phone: [phone]  Craig Shelton is a 34 y.o. male who presents today for f/u.  Anxiety/depression: Patient notes his depression is well controlled.  Anxiety is through the roof as he ran out of his medicines a few weeks ago.  He notes things were very well controlled previously on the bupropion and Wellbutrin.  He notes he has been irritable and feels lost off the medicine.  No SI.  Tobacco abuse: Smoking about a half a pack per day though it does vary some.  Wellbutrin did help to a certain degree.  He does not smoke much at home.  Elevated blood pressure: Has not been checking at home.  Denies chest pain, shortness of breath, and edema.  Notes in the past when his anxiety has been under control his blood pressure remains under control.  Social History   Tobacco Use  Smoking Status Current Every Day Smoker  Smokeless Tobacco Never Used  Tobacco Comment   smokes half a pack daily      ROS see history of present illness  Objective  Physical Exam Vitals:   03/25/20 0921  BP: 130/80  Pulse: 85  Temp: 97.9 F (36.6 C)  SpO2: 97%    BP Readings from Last 3 Encounters:  03/25/20 130/80  08/05/19 (!) 151/87  05/14/19 122/88   Wt Readings from Last 3 Encounters:  03/25/20 203 lb 12.8 oz (92.4 kg)  07/03/19 190 lb (86.2 kg)  05/14/19 200 lb (90.7 kg)    Physical Exam Constitutional:      General: He is not in acute distress.    Appearance: He is not diaphoretic.  Cardiovascular:     Rate and Rhythm: Normal rate and regular rhythm.     Heart sounds: Normal heart sounds.  Pulmonary:     Effort: Pulmonary effort is normal.     Breath sounds: Normal breath sounds.  Musculoskeletal:     Right lower leg: No edema.     Left lower leg: No edema.  Skin:    General: Skin is warm and dry.  Neurological:     Mental Status: He is alert.      Assessment/Plan: Please see individual problem list.  Problem List Items Addressed This Visit    Anxiety  and depression    His anxiety is uncontrolled.  He is not been on medicine for a few weeks.  We will restart the Celexa and Wellbutrin.  We will start with Celexa 20 mg once daily for 1 week and then increase to 40 mg once daily.  He will continue on Wellbutrin 150 mg once daily.  Follow-up in 6 weeks.      Relevant Medications   buPROPion (WELLBUTRIN XL) 150 MG 24 hr tablet   citalopram (CELEXA) 40 MG tablet   History of hypertension    BP improved on recheck.  Stress and anxiety is contributing to this.  He will start checking his blood pressure daily.  We will see him back in 6 weeks for recheck.      Tobacco abuse    Encourage smoking cessation.  Discussed Wellbutrin should help with this.  Discussed he could use nicotine patches, gums, or lozenges as well.         This visit occurred during the SARS-CoV-2 public health emergency.  Safety protocols were in place, including screening questions prior to the visit, additional usage of staff PPE, and extensive cleaning of exam room while observing appropriate contact time as indicated for disinfecting  solutions.    Tommi Rumps, MD [place]

## 2020-03-25 NOTE — Assessment & Plan Note (Addendum)
His anxiety is uncontrolled.  He is not been on medicine for a few weeks.  We will restart the Celexa and Wellbutrin.  We will start with Celexa 20 mg once daily for 1 week and then increase to 40 mg once daily.  He will continue on Wellbutrin 150 mg once daily.  Follow-up in 6 weeks.

## 2020-03-25 NOTE — Assessment & Plan Note (Addendum)
BP improved on recheck.  Stress and anxiety is contributing to this.  He will start checking his blood pressure daily.  We will see him back in 6 weeks for recheck.

## 2020-03-25 NOTE — Patient Instructions (Signed)
Nice to see you. We will restart your Wellbutrin and Celexa. We will have you follow-up in about 3 weeks to recheck your blood pressure and see how your anxiety is doing.

## 2020-04-17 ENCOUNTER — Other Ambulatory Visit: Payer: Self-pay | Admitting: Family Medicine

## 2020-05-05 ENCOUNTER — Other Ambulatory Visit: Payer: Self-pay

## 2020-05-10 ENCOUNTER — Ambulatory Visit: Payer: Self-pay | Admitting: Family Medicine

## 2020-05-10 DIAGNOSIS — Z0289 Encounter for other administrative examinations: Secondary | ICD-10-CM

## 2020-05-13 IMAGING — CR DG LUMBAR SPINE COMPLETE 4+V
5 series · 5 of 5 positions shown · non-contrast
Comparison: None.

CLINICAL DATA: Pain with intermittent leg numbness

EXAM:
LUMBAR SPINE - COMPLETE 4+ VIEW

[l-spine ap]
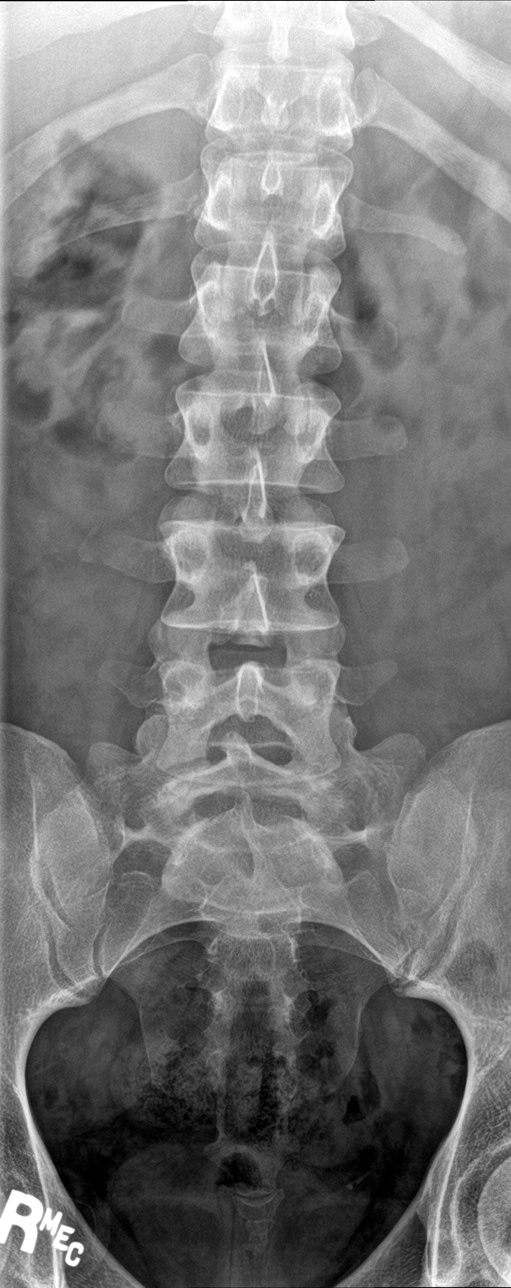

[l-spine obl (1 of 2)]
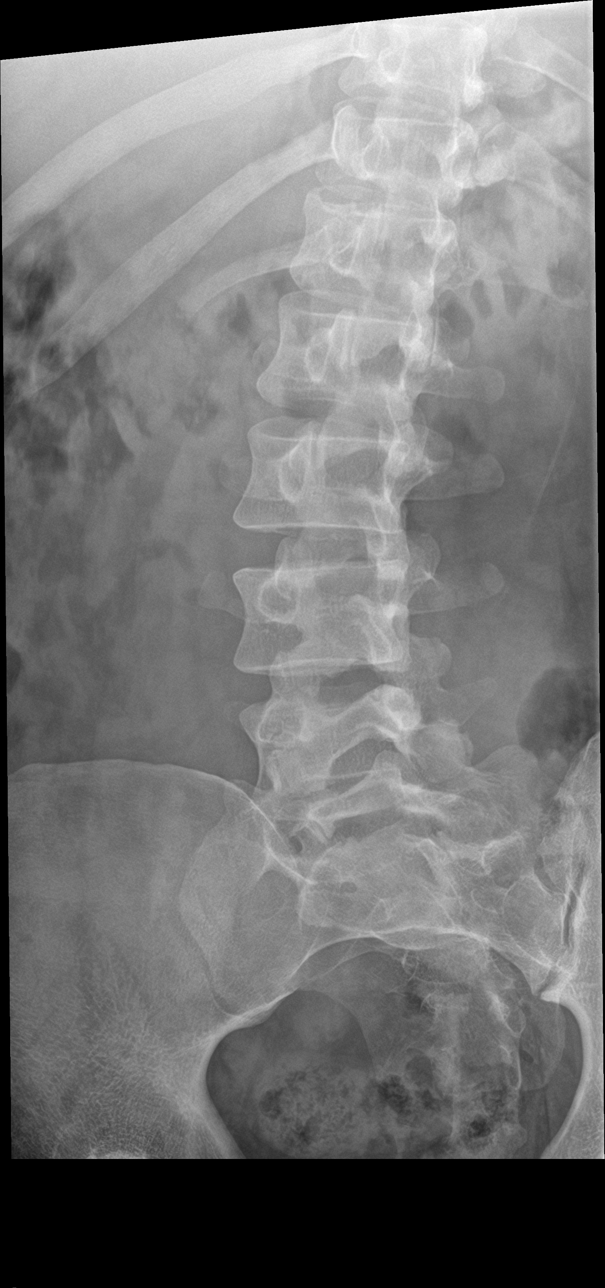

[l-spine obl (2 of 2)]
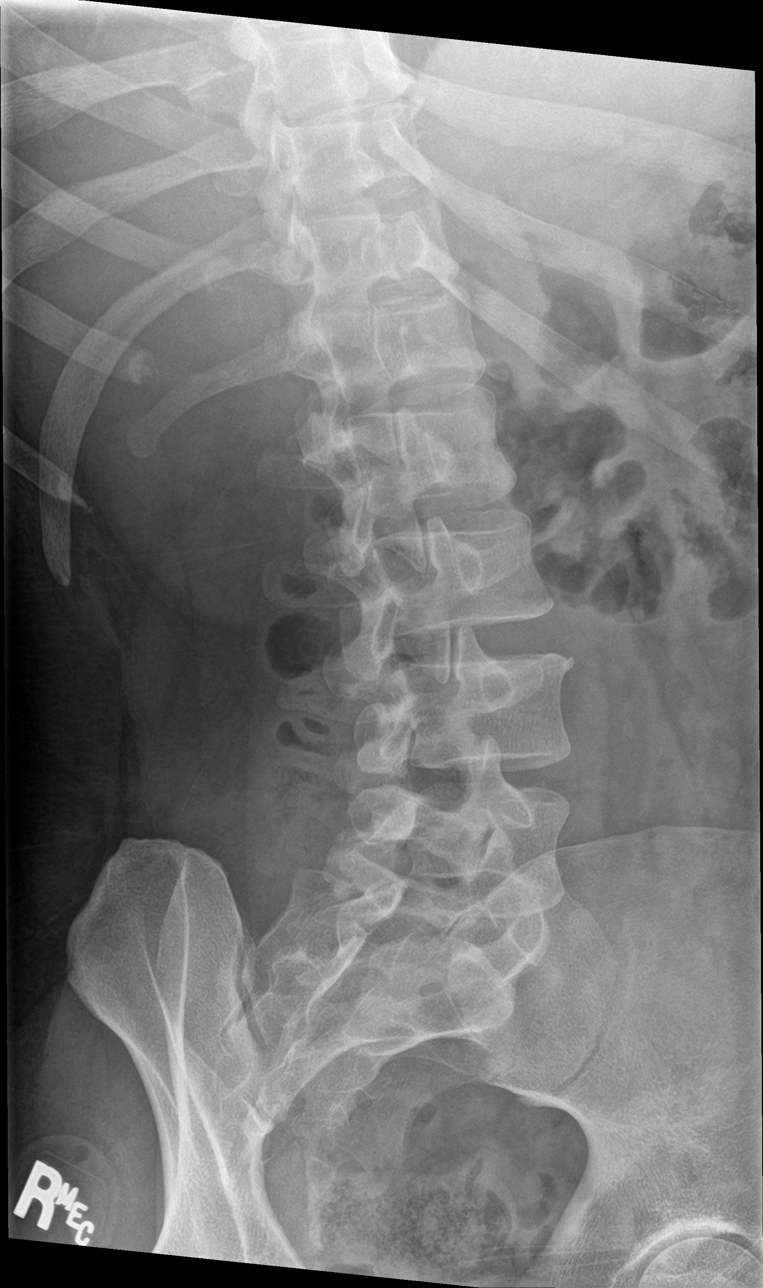

[l-spine lat]
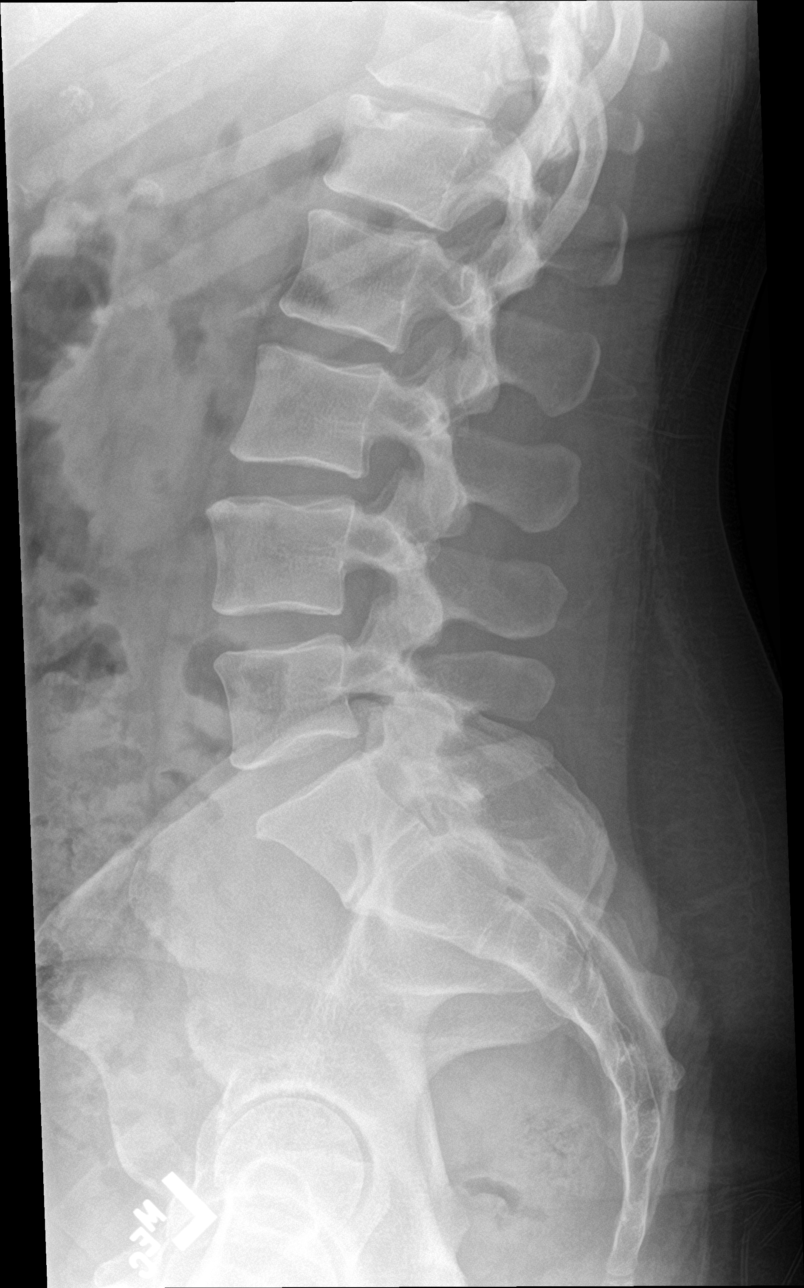

[l-spine spot]
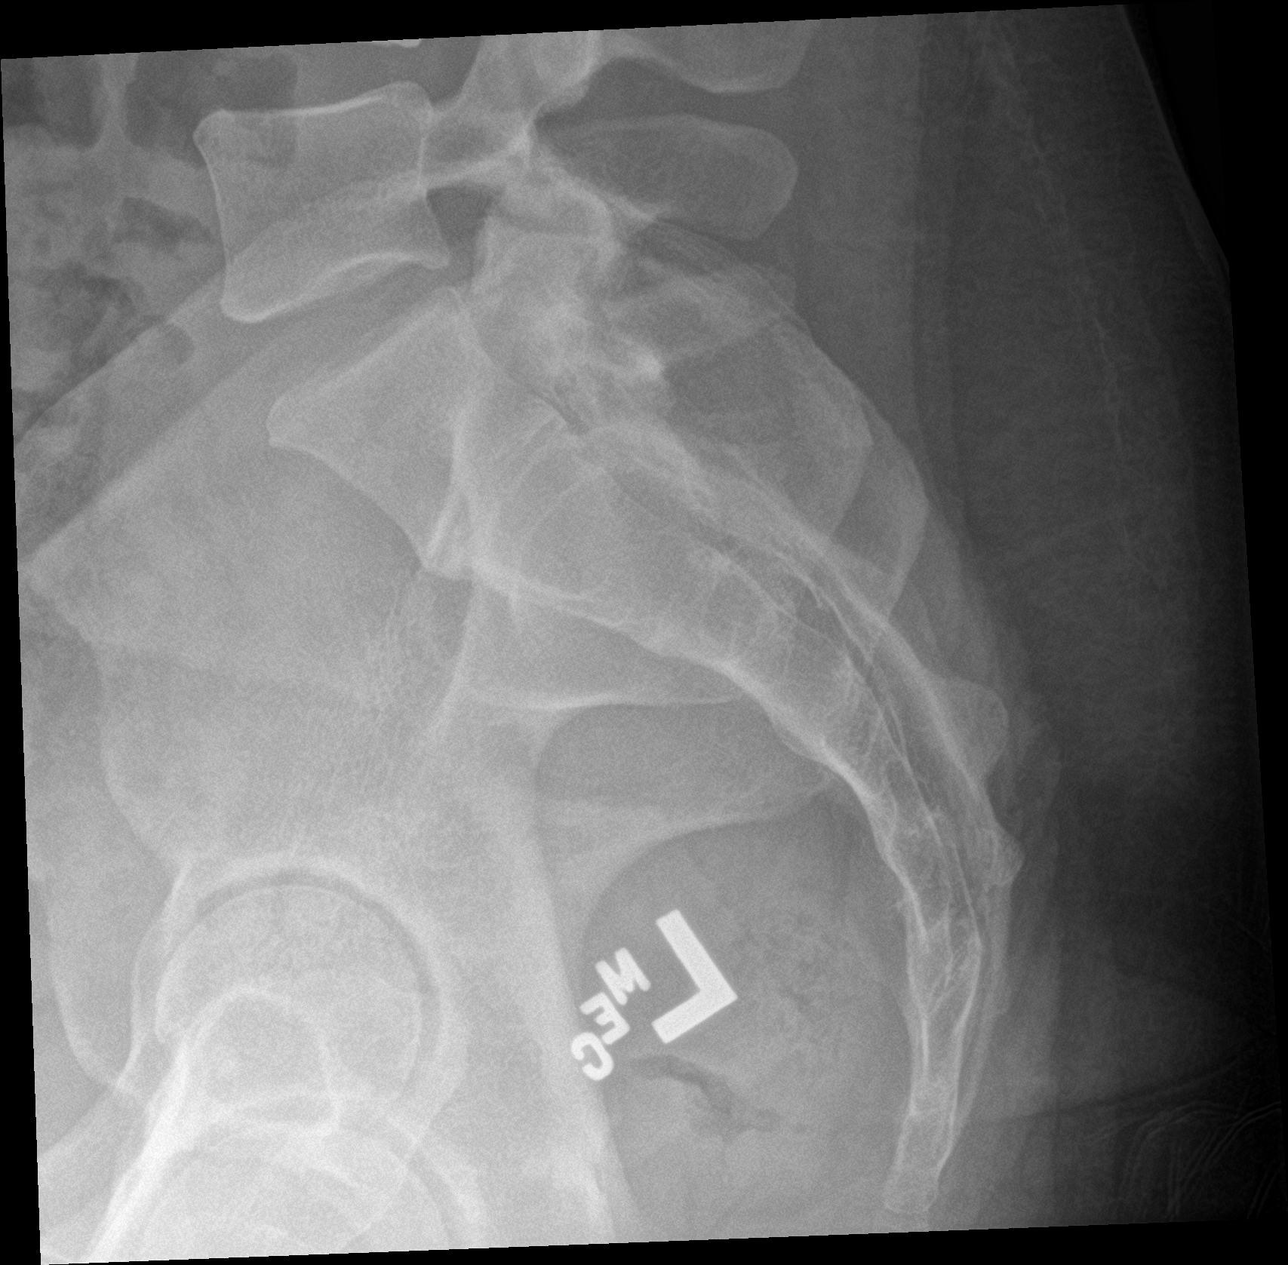

[5 of 5 positions shown; findings below may reference images not displayed]

FINDINGS: There is no evidence of lumbar spine fracture. Alignment is normal.
Intervertebral disc spaces are maintained.
IMPRESSION: Negative.

## 2020-06-21 ENCOUNTER — Encounter: Payer: Self-pay | Admitting: Family Medicine

## 2020-06-21 DIAGNOSIS — F419 Anxiety disorder, unspecified: Secondary | ICD-10-CM

## 2020-06-21 NOTE — Telephone Encounter (Signed)
This was sent to me. Looks like your pt.

## 2020-09-23 ENCOUNTER — Other Ambulatory Visit: Payer: Self-pay | Admitting: Family Medicine

## 2020-09-23 DIAGNOSIS — F419 Anxiety disorder, unspecified: Secondary | ICD-10-CM

## 2020-09-23 DIAGNOSIS — F32A Depression, unspecified: Secondary | ICD-10-CM

## 2020-10-04 ENCOUNTER — Encounter: Payer: Self-pay | Admitting: Family Medicine

## 2020-10-04 DIAGNOSIS — F32A Depression, unspecified: Secondary | ICD-10-CM

## 2020-10-04 MED ORDER — CITALOPRAM HYDROBROMIDE 40 MG PO TABS: 40.0000 mg | ORAL_TABLET | Freq: Every day | ORAL | 0 refills | Status: DC

## 2020-10-12 ENCOUNTER — Encounter: Payer: Self-pay | Admitting: Family Medicine

## 2020-10-12 ENCOUNTER — Other Ambulatory Visit: Payer: Self-pay

## 2020-10-12 ENCOUNTER — Ambulatory Visit (INDEPENDENT_AMBULATORY_CARE_PROVIDER_SITE_OTHER): Payer: 59 | Admitting: Family Medicine

## 2020-10-12 DIAGNOSIS — F17210 Nicotine dependence, cigarettes, uncomplicated: Secondary | ICD-10-CM

## 2020-10-12 DIAGNOSIS — F419 Anxiety disorder, unspecified: Secondary | ICD-10-CM

## 2020-10-12 DIAGNOSIS — G479 Sleep disorder, unspecified: Secondary | ICD-10-CM

## 2020-10-12 DIAGNOSIS — R41840 Attention and concentration deficit: Secondary | ICD-10-CM | POA: Diagnosis not present

## 2020-10-12 DIAGNOSIS — F32A Depression, unspecified: Secondary | ICD-10-CM

## 2020-10-12 MED ORDER — CITALOPRAM HYDROBROMIDE 40 MG PO TABS: 40.0000 mg | ORAL_TABLET | Freq: Every day | ORAL | 3 refills | Status: DC

## 2020-10-12 MED ORDER — BUSPIRONE HCL 7.5 MG PO TABS: 7.5000 mg | ORAL_TABLET | Freq: Two times a day (BID) | ORAL | 3 refills | Status: DC

## 2020-10-12 NOTE — Progress Notes (Addendum)
Marikay Alar, MD Phone: [phone]  Craig Shelton is a 35 y.o. male who presents today for f/u.  Anxiety: Patient notes continued issues with anxiety.  He denies depression and suicidal ideation.  He is on Celexa 40 mg daily.  He notes difficulty with anxiety and we get sleeping.  He notes trouble concentrating.  He has trouble focusing at home and at work.  He feels tired most of the time.  Notes he has had a few months of tiredness.  He has trouble falling asleep and staying asleep.  He typically gets in bed around 10 PM and wakes up around 5:30 AM.  No screens before bed.  His last caffeinated beverage is around lunchtime.  No alcohol intake.  No prior ADHD evaluation.  He wonders if low testosterone could be contributing to how he is feeling given that he had vasectomy last year.  He reports some decreased sex drive.  Tobacco abuse: Patient continues to smoke.  He has cut down to half a pack per day.  He is thinking about quitting but is not quite ready.  Social History   Tobacco Use  Smoking Status Current Every Day Smoker  Smokeless Tobacco Never Used  Tobacco Comment   smokes half a pack daily     No current outpatient medications on file prior to visit.   No current facility-administered medications on file prior to visit.     ROS see history of present illness  Objective  Physical Exam Vitals:   10/12/20 1523  BP: 118/80  Pulse: 82  Temp: 98.1 F (36.7 C)  SpO2: 97%    BP Readings from Last 3 Encounters:  10/12/20 118/80  03/25/20 130/80  08/05/19 (!) 151/87   Wt Readings from Last 3 Encounters:  10/12/20 185 lb 6.4 oz (84.1 kg)  03/25/20 203 lb 12.8 oz (92.4 kg)  07/03/19 190 lb (86.2 kg)    Physical Exam Constitutional:      General: He is not in acute distress.    Appearance: He is not diaphoretic.  Cardiovascular:     Rate and Rhythm: Normal rate and regular rhythm.     Heart sounds: Normal heart sounds.  Pulmonary:     Effort: Pulmonary  effort is normal.     Breath sounds: Normal breath sounds.  Skin:    General: Skin is warm and dry.  Neurological:     Mental Status: He is alert.      Assessment/Plan: Please see individual problem list.  Problem List Items Addressed This Visit    Anxiety and depression    The patient has had worsened anxiety.  He has had no depression.  He will continue Celexa 40 mg once daily.  We will add BuSpar 7.5 mg twice daily.  Discussed that his anxiety could be leading to his sleeping issues and his decreased sex drive though we did discuss obtaining a testosterone level.  He deferred checking his testosterone level to his next visit if his symptoms do not improve with treatment of his anxiety.  He will contact me in 6 weeks if he has noticed no improvement with the BuSpar.  Follow-up in 2 months.      Relevant Medications   busPIRone (BUSPAR) 7.5 MG tablet   citalopram (CELEXA) 40 MG tablet   Nicotine dependence, cigarettes, uncomplicated    Smoking cessation counseling was provided.  Approximately 3 minutes were spent discussing the rationale for tobacco cessation and strategies for doing so.  Adjuncts, including nicotine patches  and nicotine lozenges and nicotine gum were recommended.  Follow-up in 2 months.      Attention deficit    We will place a referral for evaluation.      Relevant Orders   Ambulatory referral to Psychology   Sleeping difficulty    Likely anxiety related.  I discussed decreasing his caffeine intake as well.  Discussed a good bedtime routine as well as strict bedtime and wake time.  We will see how he does with treatment of his anxiety.        Health maintenance: The patient declines having a COVID-vaccine.  Return in about 2 months (around 12/12/2020) for Anxiety, tobacco use.  This visit occurred during the SARS-CoV-2 public health emergency.  Safety protocols were in place, including screening questions prior to the visit, additional usage of staff PPE, and  extensive cleaning of exam room while observing appropriate contact time as indicated for disinfecting solutions.    Marikay Alar, MD [facility]

## 2020-10-12 NOTE — Assessment & Plan Note (Signed)
Likely anxiety related.  I discussed decreasing his caffeine intake as well.  Discussed a good bedtime routine as well as strict bedtime and wake time.  We will see how he does with treatment of his anxiety.

## 2020-10-12 NOTE — Assessment & Plan Note (Addendum)
The patient has had worsened anxiety.  He has had no depression.  He will continue Celexa 40 mg once daily.  We will add BuSpar 7.5 mg twice daily.  Discussed that his anxiety could be leading to his sleeping issues and his decreased sex drive though we did discuss obtaining a testosterone level.  He deferred checking his testosterone level to his next visit if his symptoms do not improve with treatment of his anxiety.  He will contact me in 6 weeks if he has noticed no improvement with the BuSpar.  Follow-up in 2 months.

## 2020-10-12 NOTE — Patient Instructions (Signed)
Nice to see you. We will start you on BuSpar to see if that helps with your anxiety.  If you notice any side effects please let us know. Somebody should contact you with your ADHD action.

## 2020-10-12 NOTE — Assessment & Plan Note (Signed)
Smoking cessation counseling was provided.  Approximately 3 minutes were spent discussing the rationale for tobacco cessation and strategies for doing so.  Adjuncts, including nicotine patches and nicotine lozenges and nicotine gum were recommended.  Follow-up in 2 months.

## 2020-10-12 NOTE — Assessment & Plan Note (Signed)
We will place a referral for evaluation.

## 2020-12-14 ENCOUNTER — Ambulatory Visit: Payer: 59 | Admitting: Family Medicine

## 2021-07-20 ENCOUNTER — Telehealth: Payer: Self-pay | Admitting: Family Medicine

## 2021-07-20 NOTE — Telephone Encounter (Signed)
Called for Previsit Planning unable to reach patient. Left message to call office. ?

## 2021-07-21 ENCOUNTER — Ambulatory Visit (INDEPENDENT_AMBULATORY_CARE_PROVIDER_SITE_OTHER): Payer: 59 | Admitting: Family Medicine

## 2021-07-21 ENCOUNTER — Telehealth: Payer: Self-pay | Admitting: Family Medicine

## 2021-07-21 DIAGNOSIS — Z91199 Patient's noncompliance with other medical treatment and regimen due to unspecified reason: Secondary | ICD-10-CM

## 2021-07-21 NOTE — Telephone Encounter (Signed)
This patient no showed for his appointment today.  Please contact him to get him rescheduled.  Thanks. ?

## 2021-07-21 NOTE — Progress Notes (Signed)
Patient was a no-show 

## 2021-07-24 NOTE — Telephone Encounter (Signed)
I called the patient and LVM for the patient to call back and reschedule his follow up.  Shamira Toutant,cma  ?

## 2021-08-03 NOTE — Telephone Encounter (Signed)
I called and scheduled the patient for a follow up. Thaer Miyoshi,cma  ?

## 2021-08-16 ENCOUNTER — Encounter: Payer: Self-pay | Admitting: Family Medicine

## 2021-08-16 ENCOUNTER — Telehealth: Payer: Self-pay | Admitting: Family Medicine

## 2021-08-16 ENCOUNTER — Ambulatory Visit (INDEPENDENT_AMBULATORY_CARE_PROVIDER_SITE_OTHER): Payer: 59 | Admitting: Family Medicine

## 2021-08-16 DIAGNOSIS — G4710 Hypersomnia, unspecified: Secondary | ICD-10-CM

## 2021-08-16 DIAGNOSIS — Z8679 Personal history of other diseases of the circulatory system: Secondary | ICD-10-CM

## 2021-08-16 DIAGNOSIS — F32A Depression, unspecified: Secondary | ICD-10-CM

## 2021-08-16 DIAGNOSIS — F419 Anxiety disorder, unspecified: Secondary | ICD-10-CM

## 2021-08-16 MED ORDER — FLUOXETINE HCL 20 MG PO TABS: ORAL_TABLET | ORAL | 3 refills | Status: DC

## 2021-08-16 MED ORDER — SERTRALINE HCL 50 MG PO TABS: ORAL_TABLET | ORAL | 1 refills | Status: DC

## 2021-08-16 MED ORDER — CITALOPRAM HYDROBROMIDE 40 MG PO TABS: 20.0000 mg | ORAL_TABLET | Freq: Every day | ORAL | 3 refills | Status: DC

## 2021-08-16 NOTE — Assessment & Plan Note (Signed)
Concerning for sleep apnea given elevated Epworth Sleepiness Scale score.  Home sleep study ordered.  He was advised to contact us if he does not hear about this being scheduled the next 2 weeks. ?

## 2021-08-16 NOTE — Patient Instructions (Addendum)
Nice to see you. ?Please decrease your Celexa dose to 20 mg (half a tablet) once daily for 14 days.  At the same time we will start on Prozac 10 mg (half a tablet) once daily.  At the end of 14 days you will discontinue the Celexa and increase the Prozac to 20 mg (1 tablet).  If you notice any symptoms while tapering the Celexa please let us know. ?Psychiatry should contact you to schedule an appointment. ?

## 2021-08-16 NOTE — Assessment & Plan Note (Addendum)
Worsened recently.  This certainly could be contributing to his fatigue.  We will taper him down on the Celexa to 20 mg once daily and he will start on Prozac 10 mg once daily at the same time as he is tapering the Celexa.  After 14 days he will discontinue the Celexa and increase the Prozac to 20 mg.  I will see him back in about 6 weeks.  We will additionally refer him to psychiatry at his request. ?

## 2021-08-16 NOTE — Assessment & Plan Note (Signed)
Blood pressure elevated today though had been previously well controlled.  We will see what that is in 6 weeks and if still elevated discussed potential medication. ?

## 2021-08-16 NOTE — Progress Notes (Signed)
?Marikay Alar, MD ?Phone: [phone] ? ?Craig Shelton is a 36 y.o. male who presents today for f/u. ? ?Anxiety/depression: Patient continues to have issues with both of these.  He has no motivation.  He notes his wife thinks he is mad and miserable most of the time though he does not think he feels that way.  His anxiety has been up.  He is having some rockiness with his marriage.  He notes his wife has gone back to work teaching after not working for a period of time.  He reports he feels fatigued.  He will go to bed at a decent time and get enough sleep and still feel poorly rested.  He does note feeling tired throughout the day though does not fall asleep.  He does snore though notes no apneic episodes.  Reports he had significant irritability on Zoloft. ? ? ?  08/16/2021  ? 11:47 AM 05/21/2018  ?  4:37 PM  ?GAD 7 : Generalized Anxiety Score  ?Nervous, Anxious, on Edge 3 3  ?Control/stop worrying 2 3  ?Worry too much - different things 2 3  ?Trouble relaxing 2 3  ?Restless 3 3  ?Easily annoyed or irritable 3 3  ?Afraid - awful might happen 1 1  ?Total GAD 7 Score 16 19  ?Anxiety Difficulty Extremely difficult   ? ? ?  08/16/2021  ? 11:18 AM 07/03/2019  ?  3:36 PM 07/03/2019  ?  3:08 PM 02/16/2019  ?  4:05 PM 05/21/2018  ?  4:37 PM  ?Depression screen PHQ 2/9  ?Decreased Interest 3 1 0 0 3  ?Down, Depressed, Hopeless 2 1 0 0 1  ?PHQ - 2 Score 5 2 0 0 4  ?Altered sleeping 3 3   3   ?Tired, decreased energy 3 3   2   ?Change in appetite 2 3   3   ?Feeling bad or failure about yourself  3 1   2   ?Trouble concentrating 3 3   3   ?Moving slowly or fidgety/restless 2 1   2   ?Suicidal thoughts 0 0   0  ?PHQ-9 Score 21 16   19   ?Difficult doing work/chores Extremely dIfficult Extremely dIfficult     ? ? ?Elevated blood pressure: Patient notes he does not check his blood pressure at home.  No chest pain or shortness of breath. ? ?Social History  ? ?Tobacco Use  ?Smoking Status Every Day  ?Smokeless Tobacco Never  ?Tobacco  Comments  ? smokes half a pack daily   ? ? ?No current outpatient medications on file prior to visit.  ? ?No current facility-administered medications on file prior to visit.  ? ? ? ?ROS see history of present illness ? ?Objective ? ?Physical Exam ?Vitals:  ? 08/16/21 1117  ?BP: (!) 141/90  ?Pulse: (!) 112  ?Temp: 98.5 ?F (36.9 ?C)  ?SpO2: 97%  ? ? ?BP Readings from Last 3 Encounters:  ?08/16/21 (!) 141/90  ?10/12/20 118/80  ?03/25/20 130/80  ? ?Wt Readings from Last 3 Encounters:  ?08/16/21 199 lb 3.2 oz (90.4 kg)  ?10/12/20 185 lb 6.4 oz (84.1 kg)  ?03/25/20 203 lb 12.8 oz (92.4 kg)  ? ? ?Physical Exam ?Constitutional:   ?   General: He is not in acute distress. ?   Appearance: He is not diaphoretic.  ?Cardiovascular:  ?   Rate and Rhythm: Regular rhythm. Tachycardia present.  ?   Heart sounds: Normal heart sounds.  ?Pulmonary:  ?   Effort: Pulmonary effort  is normal.  ?   Breath sounds: Normal breath sounds.  ?Skin: ?   General: Skin is warm and dry.  ?Neurological:  ?   Mental Status: He is alert.  ? ?Epworth Sleepiness Scale score of 16 with individual scores of 3, 2, 2, 3, 3, 0, 3, 0 ? ?Assessment/Plan: Please see individual problem list. ? ?Problem List Items Addressed This Visit   ? ? Anxiety and depression  ?  Worsened recently.  This certainly could be contributing to his fatigue.  We will taper him down on the Celexa to 20 mg once daily and he will start on Prozac 10 mg once daily at the same time as he is tapering the Celexa.  After 14 days he will discontinue the Celexa and increase the Prozac to 20 mg.  I will see him back in about 6 weeks.  We will additionally refer him to psychiatry at his request. ?  ?  ? Relevant Medications  ? FLUoxetine (PROZAC) 20 MG tablet  ? Other Relevant Orders  ? Ambulatory referral to Psychiatry  ? History of hypertension  ?  Blood pressure elevated today though had been previously well controlled.  We will see what that is in 6 weeks and if still elevated discussed  potential medication. ?  ?  ? Hypersomnia  ?  Concerning for sleep apnea given elevated Epworth Sleepiness Scale score.  Home sleep study ordered.  He was advised to contact us if he does not hear about this being scheduled the next 2 weeks. ?  ?  ? Relevant Orders  ? Home sleep test  ? ? ? ?Return in about 6 weeks (around 09/27/2021) for Anxiety/depression. ? ?This visit occurred during the SARS-CoV-2 public health emergency.  Safety protocols were in place, including screening questions prior to the visit, additional usage of staff PPE, and extensive cleaning of exam room while observing appropriate contact time as indicated for disinfecting solutions.  ? ?I have spent 41 minutes in the care of this patient regarding history taking, documentation, completion of exam, discussion of plan, review of PHQ-9, GAD-7, and Epworth Sleepiness Scale score, counseling on medication changes. ? ? ?Marikay Alar, MD ?[facility] [place] ? ?

## 2021-08-16 NOTE — Telephone Encounter (Signed)
I called back to the pharmacy and informed them to cancel the zoloft.  Montravious Weigelt,cma  ?

## 2021-08-16 NOTE — Telephone Encounter (Signed)
Craig Shelton From [facility] called in stating that the pharmacy received two scripts, one for medication (FLUoxetine (PROZAC) 20 MG tablet) and (sertraline)... Craig Shelton was wondering if pt suppose to be on both medication at same time... Pharmacy requesting callback ?

## 2021-10-04 ENCOUNTER — Encounter: Payer: Self-pay | Admitting: Family Medicine

## 2021-10-04 ENCOUNTER — Telehealth (INDEPENDENT_AMBULATORY_CARE_PROVIDER_SITE_OTHER): Payer: 59 | Admitting: Family Medicine

## 2021-10-04 VITALS — Ht 65.0 in | Wt 199.0 lb

## 2021-10-04 DIAGNOSIS — J029 Acute pharyngitis, unspecified: Secondary | ICD-10-CM

## 2021-10-04 DIAGNOSIS — R051 Acute cough: Secondary | ICD-10-CM

## 2021-10-04 LAB — POCT INFLUENZA A/B
Influenza A, POC: NEGATIVE
Influenza B, POC: NEGATIVE

## 2021-10-04 LAB — POCT RAPID STREP A (OFFICE): Rapid Strep A Screen: NEGATIVE

## 2021-10-04 NOTE — Progress Notes (Signed)
Virtual Visit via video Note  This visit type was conducted due to national recommendations for restrictions regarding the COVID-19 pandemic (e.g. social distancing).  This format is felt to be most appropriate for this patient at this time.  All issues noted in this document were discussed and addressed.  No physical exam was performed (except for noted visual exam findings with Video Visits).   I connected with Craig Shelton today at  2:45 PM EDT by a video enabled telemedicine application and verified that I am speaking with the correct person using two identifiers. Location patient: home Location provider: home office Persons participating in the virtual visit: patient, provider  I discussed the limitations, risks, security and privacy concerns of performing an evaluation and management service by telephone and the availability of in person appointments. I also discussed with the patient that there may be a patient responsible charge related to this service. The patient expressed understanding and agreed to proceed.  Reason for visit: same day visit.   HPI: Congestion: Patient reports onset of symptoms 2 days ago.  He has had some cough and some head congestion with some sore throat mild postnasal drip.  Reports a fever last night.  He has also had body aches.  No shortness of breath, ear pain, loss of taste, or loss of smell.  No known COVID exposure.  He has not tested himself for COVID.  His son does have strep throat.  He has been using cough drops though no other medications.   ROS: See pertinent positives and negatives per HPI.  Past Medical History:  Diagnosis Date   Concussion     No past surgical history on file.  Family History  Problem Relation Age of Onset   Colon cancer Mother    Prostate cancer Maternal Grandfather    Stomach cancer Paternal Grandmother    Prostate cancer Paternal Grandfather     SOCIAL HX: Smoker   Current Outpatient Medications:     FLUoxetine (PROZAC) 20 MG tablet, Take 0.5 tablets (10 mg total) by mouth daily for 14 days, THEN 1 tablet (20 mg total) daily., Disp: 90 tablet, Rfl: 3  EXAM:  VITALS per patient if applicable:  GENERAL: alert, oriented, appears well and in no acute distress  HEENT: atraumatic, conjunttiva clear, no obvious abnormalities on inspection of external nose and ears  NECK: normal movements of the head and neck  LUNGS: on inspection no signs of respiratory distress, breathing rate appears normal, no obvious gross SOB, gasping or wheezing  CV: no obvious cyanosis  MS: moves all visible extremities without noticeable abnormality  PSYCH/NEURO: pleasant and cooperative, no obvious depression or anxiety, speech and thought processing grossly intact  ASSESSMENT AND PLAN:  Discussed the following assessment and plan:  Problem List Items Addressed This Visit     Acute cough - Primary    I suspect the patient has a viral illness.  He will come to the office to have COVID testing, flu testing, and strep testing.  The strep testing is done given that his son has strep throat and he has been exposed.  Discussed supportive care with Tylenol and ibuprofen.  Discussed use of over-the-counter cough medication.  Advised to stay home until we get his COVID PCR test result back.  Work note provided.       Relevant Orders   Novel Coronavirus, NAA ([facility])   POCT Influenza A/B   Other Visit Diagnoses     Sore throat  Relevant Orders   Novel Coronavirus, NAA ([facility])   POCT Influenza A/B   POCT rapid strep A       Return if symptoms worsen or fail to improve.   I discussed the assessment and treatment plan with the patient. The patient was provided an opportunity to ask questions and all were answered. The patient agreed with the plan and demonstrated an understanding of the instructions.   The patient was advised to call back or seek an in-person evaluation if the symptoms worsen or if  the condition fails to improve as anticipated.  Marikay Alar, MD

## 2021-10-04 NOTE — Assessment & Plan Note (Signed)
I suspect the patient has a viral illness.  He will come to the office to have COVID testing, flu testing, and strep testing.  The strep testing is done given that his son has strep throat and he has been exposed.  Discussed supportive care with Tylenol and ibuprofen.  Discussed use of over-the-counter cough medication.  Advised to stay home until we get his COVID PCR test result back.  Work note provided.

## 2021-10-05 LAB — NOVEL CORONAVIRUS, NAA: SARS-CoV-2, NAA: NOT DETECTED

## 2022-02-19 ENCOUNTER — Ambulatory Visit: Payer: Managed Care, Other (non HMO) | Admitting: Family Medicine

## 2022-02-19 ENCOUNTER — Encounter: Payer: Self-pay | Admitting: Family Medicine

## 2022-02-19 DIAGNOSIS — F32A Depression, unspecified: Secondary | ICD-10-CM

## 2022-02-19 DIAGNOSIS — F419 Anxiety disorder, unspecified: Secondary | ICD-10-CM

## 2022-02-19 DIAGNOSIS — R41840 Attention and concentration deficit: Secondary | ICD-10-CM

## 2022-02-19 MED ORDER — HYDROXYZINE HCL 10 MG PO TABS: 10.0000 mg | ORAL_TABLET | Freq: Three times a day (TID) | ORAL | 0 refills | Status: DC | PRN

## 2022-02-19 MED ORDER — DULOXETINE HCL 30 MG PO CPEP: ORAL_CAPSULE | ORAL | 1 refills | Status: DC

## 2022-02-19 NOTE — Assessment & Plan Note (Signed)
Refer for evaluation.  Discussed that his anxiety could be contributing to this.

## 2022-02-19 NOTE — Progress Notes (Signed)
Tommi Rumps, MD Phone: [phone]  Craig Shelton is a 36 y.o. male who presents today for follow-up.  Anxiety/depression: Patient notes his anxiety has increased significantly.  He has had some depression.  He is having panic attacks intermittently.  He feels like he is in a panic and will have some shortness of breath and he feels like can't figure out what to do.  When it has happened at home he will get in the shower to help calm down.  He has a general overwhelming feeling like he is going to have a panic attack most of the time.  He has anxiety at baseline.  He notes no SI or HI.  He has been doing therapy.  He is on Prozac currently.  He was previously on Lexapro and Celexa though notes those quit working.  Zoloft made him angry.  He notes a lot of trouble focusing and wonders about an ADD evaluation.  Social History   Tobacco Use  Smoking Status Every Day  Smokeless Tobacco Never  Tobacco Comments   smokes half a pack daily     No current outpatient medications on file prior to visit.   No current facility-administered medications on file prior to visit.     ROS see history of present illness  Objective  Physical Exam Vitals:   02/19/22 0949  BP: 110/70  Pulse: 76  Temp: 98.5 F (36.9 C)  SpO2: 97%    BP Readings from Last 3 Encounters:  02/19/22 110/70  08/16/21 (!) 141/90  10/12/20 118/80   Wt Readings from Last 3 Encounters:  02/19/22 205 lb 6.4 oz (93.2 kg)  10/04/21 199 lb (90.3 kg)  08/16/21 199 lb 3.2 oz (90.4 kg)    Physical Exam Constitutional:      General: He is not in acute distress.    Appearance: He is not diaphoretic.  Cardiovascular:     Rate and Rhythm: Normal rate and regular rhythm.     Heart sounds: Normal heart sounds.  Pulmonary:     Effort: Pulmonary effort is normal.     Breath sounds: Normal breath sounds.  Skin:    General: Skin is warm and dry.  Neurological:     Mental Status: He is alert.       Assessment/Plan: Please see individual problem list.  Problem List Items Addressed This Visit     Anxiety and depression (Chronic)    Worsened.  We will change him over to Cymbalta 30 mg once daily for 14 days and then increase the dose to 60 mg daily.  Discussed he can discontinue the Prozac without tapering off of this.  Discussed that his symptoms may get worse initially though if they get significantly worse he needs to let us know.  If he has suicidal thoughts he will go to the emergency room.  Discussed the risk of sexual dysfunction, sleep issues, and weight gain with the Cymbalta.  We will treat acute anxiety with hydroxyzine 10 mg 3 times daily as needed for anxiety.  Discussed risk of drowsiness with this.  Advised to try this at home first in case he does get excessively drowsy.  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      Relevant Medications   DULoxetine (CYMBALTA) 30 MG capsule   hydrOXYzine (ATARAX) 10 MG tablet   Attention deficit (Chronic)    Refer for evaluation.  Discussed that his anxiety could be contributing to  this.      Relevant Orders   Ambulatory referral to Psychology     Return in about 6 weeks (around 04/02/2022) for /Depression.   Marikay Alar, MD [facility]

## 2022-02-19 NOTE — Assessment & Plan Note (Addendum)
Worsened.  We will change him over to Cymbalta 30 mg once daily for 14 days and then increase the dose to 60 mg daily.  Discussed he can discontinue the Prozac without tapering off of this.  Discussed that his symptoms may get worse initially though if they get significantly worse he needs to let us know.  If he has suicidal thoughts he will go to the emergency room.  Discussed the risk of sexual dysfunction, sleep issues, and weight gain with the Cymbalta.  We will treat acute anxiety with hydroxyzine 10 mg 3 times daily as needed for anxiety.  Discussed risk of drowsiness with this.  Advised to try this at home first in case he does get excessively drowsy.  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

## 2022-02-19 NOTE — Patient Instructions (Signed)
Nice to see you. We will start you on Cymbalta 30 mg once daily for 14 days then you will increase the Cymbalta to 60 mg once daily. You can stop the Prozac. If you have any worsening symptoms that are significant please let me know. If you have suicidal thoughts please seek medical attention in the emergency room.

## 2022-04-09 ENCOUNTER — Ambulatory Visit (INDEPENDENT_AMBULATORY_CARE_PROVIDER_SITE_OTHER): Payer: Managed Care, Other (non HMO) | Admitting: Family Medicine

## 2022-04-09 ENCOUNTER — Encounter: Payer: Self-pay | Admitting: Family Medicine

## 2022-04-09 VITALS — BP 122/80 | HR 99 | Temp 97.6°F | Ht 65.0 in | Wt 208.0 lb

## 2022-04-09 DIAGNOSIS — F32A Depression, unspecified: Secondary | ICD-10-CM

## 2022-04-09 DIAGNOSIS — F419 Anxiety disorder, unspecified: Secondary | ICD-10-CM

## 2022-04-09 MED ORDER — MIRTAZAPINE 15 MG PO TABS: 15.0000 mg | ORAL_TABLET | Freq: Every day | ORAL | 3 refills | Status: DC

## 2022-04-09 NOTE — Assessment & Plan Note (Addendum)
Unchanged.  We are going to switch him from Cymbalta to Remeron.  He will reduce his Cymbalta dose to 30 mg daily for 14 days and then discontinue this.  He will start on Remeron 15 mg nightly.  Discussed risk of drowsiness with this.  If he gets excessively drowsy he will let us know.  He can take hydroxyzine 20 mg 3 times daily as needed for anxiety or panic attacks.  If this makes him drowsy he will let us know and discontinue use of the hydroxyzine.  I suspect his headaches may be stress related though if they do persist with use of hydroxyzine we could have him discontinue that to see if that is contributing.  The night sweats are likely related to the Cymbalta.  If they do not improve with discontinuing this he will let us know and we will consider further workup.

## 2022-04-09 NOTE — Patient Instructions (Signed)
Nice to see you. Please reduce your Cymbalta dose to 30 mg daily for 14 days and then you can discontinue this.  I am going to start you on Remeron 15 mg nightly.  This could make you drowsy.  If it makes you excessively drowsy the next day please let us know.

## 2022-04-09 NOTE — Progress Notes (Signed)
  Marikay Alar, MD Phone: [phone]  Craig Shelton is a 36 y.o. male who presents today for follow-up.  Anxiety/depression: Patient notes no improvement with the Cymbalta.  He has been having nightly night sweats or has to change clothes at times.  He notes this worsens significantly with the Cymbalta.  No SI.  Hydroxyzine helped at first though now is not helpful.  He gets a headache the 3 to 4 days after having a panic attack and taking a hydroxyzine.  He notes minimal drowsiness with the hydroxyzine.  Social History   Tobacco Use  Smoking Status Every Day  Smokeless Tobacco Never  Tobacco Comments   smokes half a pack daily     Current Outpatient Medications on File Prior to Visit  Medication Sig Dispense Refill   hydrOXYzine (ATARAX) 10 MG tablet Take 1 tablet (10 mg total) by mouth 3 (three) times daily as needed for anxiety. 30 tablet 0   No current facility-administered medications on file prior to visit.     ROS see history of present illness  Objective  Physical Exam Vitals:   04/09/22 1027  BP: 122/80  Pulse: 99  Temp: 97.6 F (36.4 C)  SpO2: 97%    BP Readings from Last 3 Encounters:  04/09/22 122/80  02/19/22 110/70  08/16/21 (!) 141/90   Wt Readings from Last 3 Encounters:  04/09/22 208 lb (94.3 kg)  02/19/22 205 lb 6.4 oz (93.2 kg)  10/04/21 199 lb (90.3 kg)    Physical Exam Constitutional:      General: He is not in acute distress. Neurological:     Mental Status: He is alert.      Assessment/Plan: Please see individual problem list.  Problem List Items Addressed This Visit     Anxiety and depression - Primary (Chronic)    Unchanged.  We are going to switch him from Cymbalta to Remeron.  He will reduce his Cymbalta dose to 30 mg daily for 14 days and then discontinue this.  He will start on Remeron 15 mg nightly.  Discussed risk of drowsiness with this.  If he gets excessively drowsy he will let us know.  He can take hydroxyzine 20  mg 3 times daily as needed for anxiety or panic attacks.  If this makes him drowsy he will let us know and discontinue use of the hydroxyzine.  I suspect his headaches may be stress related though if they do persist with use of hydroxyzine we could have him discontinue that to see if that is contributing.  The night sweats are likely related to the Cymbalta.  If they do not improve with discontinuing this he will let us know and we will consider further workup.      Relevant Medications   mirtazapine (REMERON) 15 MG tablet     Return in about 6 weeks (around 05/21/2022) for Anxiety/depression.   Marikay Alar, MD [facility]

## 2022-04-19 ENCOUNTER — Ambulatory Visit
Admission: EM | Admit: 2022-04-19 | Discharge: 2022-04-19 | Disposition: A | Discharge status: Home / Self Care | Payer: Managed Care, Other (non HMO) | Attending: Urgent Care | Admitting: Urgent Care

## 2022-04-19 DIAGNOSIS — R6889 Other general symptoms and signs: Secondary | ICD-10-CM

## 2022-04-19 DIAGNOSIS — Z1152 Encounter for screening for COVID-19: Secondary | ICD-10-CM | POA: Insufficient documentation

## 2022-04-19 HISTORY — DX: Anxiety disorder, unspecified: F41.9

## 2022-04-19 HISTORY — DX: Depression, unspecified: F32.A

## 2022-04-19 LAB — RESP PANEL BY RT-PCR (FLU A&B, COVID) ARPGX2
Influenza A by PCR: POSITIVE — AB
Influenza B by PCR: NEGATIVE
SARS Coronavirus 2 by RT PCR: NEGATIVE

## 2022-04-19 MED ORDER — OSELTAMIVIR PHOSPHATE 75 MG PO CAPS: 75.0000 mg | ORAL_CAPSULE | Freq: Two times a day (BID) | ORAL | 0 refills | Status: DC

## 2022-04-19 NOTE — Discharge Instructions (Signed)
You have been diagnosed with a viral upper respiratory infection based on your symptoms and exam. Viral illnesses cannot be treated with antibiotics - they are self limiting - and you should find your symptoms resolving within a few days. Get plenty of rest and non-caffeinated fluids.  We have performed a respiratory swab checking for COVID, and influenza.  I have treated you with antiviral therapy for influenza a given your positive contact with your son.  Someone will contact you when the results of your testing are available with instructions to continue or discontinue this medication.   We recommend you use over-the-counter medications for symptom control including Tylenol or ibuprofen for fever, chills or body aches, and cold/cough medication.  Saline mist spray is helpful for removing excess mucus from your nose.  Room humidifiers are helpful to ease breathing at night. You might also find relief of nasal/sinus congestion symptoms by using a nasal decongestant such as Sudafed sinus (pseudoephedrine).  You will need to obtain this medication from behind the pharmacist counter.  Speak to the pharmacist to verify that you are not duplicating medications with other over-the-counter formulations that you may be using.   Follow up here or with your primary care provider if your symptoms are worsening or not improving.

## 2022-04-19 NOTE — ED Provider Notes (Signed)
Renaldo Fiddler    CSN: [id number] Arrival date & time: 04/19/22  0940      History   Chief Complaint No chief complaint on file.   HPI Craig Shelton is a 36 y.o. male.   HPI  Dents to urgent care with symptoms starting about 1 1/2 ago.  Symptoms include fever (documented 102-104), body aches, dizziness initially but now improving. Also endorses cough which keeps him awake at night. Has not used nighttime cough/cold medication.  Endorses positive contact with his son who was positive for flu.  Ibuprofen this morning at approximately 8:30 AM.  Past Medical History:  Diagnosis Date   Concussion     Patient Active Problem List   Diagnosis Date Noted   Acute cough 10/04/2021   Hypersomnia 08/16/2021   Acute bilateral low back pain with left-sided sciatica 07/03/2019   Vasectomy evaluation 02/16/2019   Sleeping difficulty 08/20/2018   Irritability 07/19/2018   Attention deficit 07/22/2017   Chronic low back pain 07/22/2017   Anxiety and depression 12/31/2016   History of hypertension 12/31/2016   Nicotine dependence, cigarettes, uncomplicated 12/31/2016    No past surgical history on file.     Home Medications    Prior to Admission medications   Medication Sig Start Date End Date Taking? Authorizing Provider  hydrOXYzine (ATARAX) 10 MG tablet Take 1 tablet (10 mg total) by mouth 3 (three) times daily as needed for anxiety. 02/19/22   Glori Luis, MD  mirtazapine (REMERON) 15 MG tablet Take 1 tablet (15 mg total) by mouth at bedtime. 04/09/22   Glori Luis, MD    Family History Family History  Problem Relation Age of Onset   Colon cancer Mother    Prostate cancer Maternal Grandfather    Stomach cancer Paternal Grandmother    Prostate cancer Paternal Grandfather     Social History Social History   Tobacco Use   Smoking status: Every Day   Smokeless tobacco: Never   Tobacco comments:    smokes half a pack daily   Substance Use  Topics   Alcohol use: Yes   Drug use: No     Allergies   Patient has no known allergies.   Review of Systems Review of Systems   Physical Exam Triage Vital Signs ED Triage Vitals [04/19/22 1034]  Enc Vitals Group     BP 135/85     Pulse Rate 84     Resp 18     Temp 98.6 F (37 C)     Temp Source Oral     SpO2 95 %     Weight      Height      Head Circumference      Peak Flow      Pain Score      Pain Loc      Pain Edu?      Excl. in GC?    No data found.  Updated Vital Signs BP 135/85 (BP Location: Left Arm)   Pulse 84   Temp 98.6 F (37 C) (Oral)   Resp 18   SpO2 95%   Visual Acuity Right Eye Distance:   Left Eye Distance:   Bilateral Distance:    Right Eye Near:   Left Eye Near:    Bilateral Near:     Physical Exam Vitals reviewed.  Constitutional:      Appearance: He is ill-appearing.  Cardiovascular:     Rate and Rhythm: Normal rate and regular rhythm.  Pulses: Normal pulses.     Heart sounds: Normal heart sounds.  Pulmonary:     Effort: Pulmonary effort is normal.     Breath sounds: Normal breath sounds.  Skin:    General: Skin is warm and dry.  Neurological:     General: No focal deficit present.     Mental Status: He is alert and oriented to person, place, and time.  Psychiatric:        Mood and Affect: Mood normal.        Behavior: Behavior normal.      UC Treatments / Results  Labs (all labs ordered are listed, but only abnormal results are displayed) Labs Reviewed - No data to display  EKG   Radiology No results found.  Procedures Procedures (including critical care time)  Medications Ordered in UC Medications - No data to display  Initial Impression / Assessment and Plan / UC Course  I have reviewed the triage vital signs and the nursing notes.  Pertinent labs & imaging results that were available during my care of the patient were reviewed by me and considered in my medical decision making (see chart for  details).   Patient is afebrile here with recent antipyretics (ibuprofen). Satting well on room air. Overall is very ill appearing, non-toxic. He appears well hydrated and without respiratory distress. Pulmonary exam is unremarkable.  CTAB without wheezes, rhonchi, rales.  Suspect viral process, most likely influenza given his positive contact with his son.  Will treat presumptively for influenza with antiviral therapy, Tamiflu, while awaiting results of respiratory swab.  Recommending continued use of OTC medication for symptom control.  Final Clinical Impressions(s) / UC Diagnoses   Final diagnoses:  None   Discharge Instructions   None    ED Prescriptions   None    PDMP not reviewed this encounter.   Charma Igo, [place] 04/19/22 1058

## 2022-04-19 NOTE — ED Triage Notes (Signed)
Pt presents with fever (102-104), body aches, dizziness initially but improving, Ibuprofen approx 0830.  Son in household is positive for Flu.

## 2022-05-23 ENCOUNTER — Ambulatory Visit: Payer: Managed Care, Other (non HMO) | Admitting: Family Medicine

## 2022-07-27 ENCOUNTER — Ambulatory Visit: Payer: Managed Care, Other (non HMO) | Admitting: Nurse Practitioner

## 2022-07-31 ENCOUNTER — Encounter: Payer: Self-pay | Admitting: Nurse Practitioner

## 2022-07-31 ENCOUNTER — Ambulatory Visit: Payer: Managed Care, Other (non HMO) | Admitting: Nurse Practitioner

## 2022-07-31 VITALS — BP 134/78 | HR 90 | Temp 98.0°F | Ht 65.0 in | Wt 218.8 lb

## 2022-07-31 DIAGNOSIS — H6690 Otitis media, unspecified, unspecified ear: Secondary | ICD-10-CM | POA: Diagnosis not present

## 2022-07-31 DIAGNOSIS — R590 Localized enlarged lymph nodes: Secondary | ICD-10-CM | POA: Diagnosis not present

## 2022-07-31 MED ORDER — CEPHALEXIN 500 MG PO CAPS: 500.0000 mg | ORAL_CAPSULE | Freq: Three times a day (TID) | ORAL | 0 refills | Status: AC

## 2022-07-31 NOTE — Progress Notes (Signed)
Tomasita Morrow, NP-C Phone: [phone]  Craig Shelton is a 37 y.o. male who presents today for right ear pain.   Patient reports lesion/pimple behind right ear that popped approximately 7-10 days ago. Reports tenderness in area. He has also noted an enlarged area that feels like a small knot below his right ear. Reports his right ear has also felt full and he has had muffled hearing out of it for the last month. Denies fevers. Denies any other respiratory symptoms.   Social History   Tobacco Use  Smoking Status Every Day   Packs/day: 1   Types: Cigarettes  Smokeless Tobacco Never  Tobacco Comments   smokes half a pack daily     Current Outpatient Medications on File Prior to Visit  Medication Sig Dispense Refill   VYVANSE 40 MG capsule Take 40 mg by mouth daily.     mirtazapine (REMERON) 15 MG tablet Take 1 tablet (15 mg total) by mouth at bedtime. (Patient not taking: Reported on 07/31/2022) 30 tablet 3   No current facility-administered medications on file prior to visit.    ROS see history of present illness  Objective  Physical Exam Vitals:   07/31/22 1506  BP: 134/78  Pulse: 90  Temp: 98 F (36.7 C)  SpO2: 99%    BP Readings from Last 3 Encounters:  07/31/22 134/78  04/19/22 135/85  04/09/22 122/80   Wt Readings from Last 3 Encounters:  07/31/22 218 lb 12.8 oz (99.2 kg)  04/09/22 208 lb (94.3 kg)  02/19/22 205 lb 6.4 oz (93.2 kg)    Physical Exam Constitutional:      General: He is not in acute distress.    Appearance: Normal appearance.  HENT:     Head: Normocephalic.     Right Ear: Tympanic membrane is injected and bulging.     Left Ear: Tympanic membrane normal.     Ears:     Comments: Small abrasion noted where right earlobe attaches. Minimal erythema and swelling noted. No drainage present. Mild tenderness.    Nose: Nose normal.     Mouth/Throat:     Mouth: Mucous membranes are moist.     Pharynx: Oropharynx is clear.  Eyes:      Conjunctiva/sclera: Conjunctivae normal.     Pupils: Pupils are equal, round, and reactive to light.  Cardiovascular:     Rate and Rhythm: Normal rate and regular rhythm.     Heart sounds: Normal heart sounds.  Pulmonary:     Effort: Pulmonary effort is normal.     Breath sounds: Normal breath sounds.  Abdominal:     General: Abdomen is flat. Bowel sounds are normal.     Palpations: Abdomen is soft. There is no mass.     Tenderness: There is no abdominal tenderness.  Lymphadenopathy:     Cervical: Cervical adenopathy present.  Skin:    General: Skin is warm and dry.  Neurological:     General: No focal deficit present.     Mental Status: He is alert.  Psychiatric:        Mood and Affect: Mood normal.        Behavior: Behavior normal.    Assessment/Plan: Please see individual problem list.  Acute otitis media, unspecified otitis media type Assessment & Plan: Will treat with Keflex 500 TID x 7 days. Advised to use OTC antibiotic ointment on abrasion behind ear. Encouraged to return if symptoms are not improving or are worsening.  Orders: -  Cephalexin; Take 1 capsule (500 mg total) by mouth 3 (three) times daily.  Dispense: 21 capsule; Refill: 0  Cervical lymphadenopathy Assessment & Plan: Likely from ear infection and small abrasion noted behind ear. Encouraged patient to monitor area, strict return precautions given to patient.   Orders: -     Cephalexin; Take 1 capsule (500 mg total) by mouth 3 (three) times daily.  Dispense: 21 capsule; Refill: 0   Return if symptoms worsen or fail to improve.   Tomasita Morrow, NP-C [place]

## 2022-07-31 NOTE — Assessment & Plan Note (Addendum)
Will treat with Keflex 500 TID x 7 days. Advised to use OTC antibiotic ointment on abrasion behind ear. Encouraged to return if symptoms are not improving or are worsening.

## 2022-07-31 NOTE — Assessment & Plan Note (Signed)
Likely from ear infection and small abrasion noted behind ear. Encouraged patient to monitor area, strict return precautions given to patient.

## 2022-08-28 ENCOUNTER — Other Ambulatory Visit: Payer: Self-pay | Admitting: Family Medicine

## 2022-08-28 DIAGNOSIS — F32A Depression, unspecified: Secondary | ICD-10-CM

## 2022-09-03 NOTE — Telephone Encounter (Signed)
Last ov 07/31/22 Next ov 08/29/22

## 2023-07-16 ENCOUNTER — Telehealth: Payer: Self-pay | Admitting: Family Medicine

## 2023-07-16 NOTE — Telephone Encounter (Signed)
 Dr Birdie Sons is no longer at this location. Please call the office to schedule a Transfer of Care to either Dr Charlann Lange, Darleen Crocker or Kara Dies, NP. E2C2 please schedule.  Thank you
